# Patient Record
Sex: Female | Born: 1970 | Hispanic: Yes | Marital: Married | Smoking: Never smoker
Health system: Southern US, Community
[De-identification: ages and names within clinical notes are randomized; demographics above are authoritative.]

## PROBLEM LIST (undated history)

## (undated) DIAGNOSIS — K759 Inflammatory liver disease, unspecified: Secondary | ICD-10-CM

## (undated) DIAGNOSIS — N6323 Unspecified lump in the left breast, lower outer quadrant: Secondary | ICD-10-CM

## (undated) HISTORY — PX: CHOLECYSTECTOMY: SHX55

## (undated) HISTORY — DX: Unspecified lump in the left breast, lower outer quadrant: N63.23

---

## 2014-06-06 ENCOUNTER — Ambulatory Visit: Payer: Self-pay | Admitting: Family Medicine

## 2014-09-11 ENCOUNTER — Ambulatory Visit: Payer: Self-pay | Admitting: Surgery

## 2014-09-11 LAB — COMPREHENSIVE METABOLIC PANEL
ALBUMIN: 3.8 g/dL (ref 3.4–5.0)
Alkaline Phosphatase: 59 U/L
Anion Gap: 6 — ABNORMAL LOW (ref 7–16)
BUN: 12 mg/dL (ref 7–18)
Bilirubin,Total: 0.4 mg/dL (ref 0.2–1.0)
CO2: 28 mmol/L (ref 21–32)
CREATININE: 0.91 mg/dL (ref 0.60–1.30)
Calcium, Total: 8.6 mg/dL (ref 8.5–10.1)
Chloride: 104 mmol/L (ref 98–107)
EGFR (African American): >60
EGFR (Non-African Amer.): >60
GLUCOSE: 101 mg/dL — AB (ref 65–99)
Osmolality: 276 (ref 275–301)
Potassium: 3.8 mmol/L (ref 3.5–5.1)
SGOT(AST): 22 U/L (ref 15–37)
SGPT (ALT): 41 U/L
Sodium: 138 mmol/L (ref 136–145)
TOTAL PROTEIN: 9.1 g/dL — AB (ref 6.4–8.2)

## 2014-09-11 LAB — CBC WITH DIFFERENTIAL/PLATELET
Basophil #: 0 10*3/uL (ref 0.0–0.1)
Basophil %: 0.3 %
EOS ABS: 0.1 10*3/uL (ref 0.0–0.7)
Eosinophil %: 0.9 %
HCT: 41 % (ref 35.0–47.0)
HGB: 13.5 g/dL (ref 12.0–16.0)
Lymphocyte #: 1.8 10*3/uL (ref 1.0–3.6)
Lymphocyte %: 19 %
MCH: 29.9 pg (ref 26.0–34.0)
MCHC: 32.9 g/dL (ref 32.0–36.0)
MCV: 91 fL (ref 80–100)
MONO ABS: 0.9 x10 3/mm (ref 0.2–0.9)
Monocyte %: 9 %
NEUTROS ABS: 6.9 10*3/uL — AB (ref 1.4–6.5)
NEUTROS PCT: 70.8 %
PLATELETS: 319 10*3/uL (ref 150–440)
RBC: 4.51 10*6/uL (ref 3.80–5.20)
RDW: 12.9 % (ref 11.5–14.5)
WBC: 9.7 10*3/uL (ref 3.6–11.0)

## 2014-09-11 LAB — URINALYSIS, COMPLETE
BACTERIA: NONE SEEN
BILIRUBIN, UR: NEGATIVE
Blood: NEGATIVE
GLUCOSE, UR: NEGATIVE mg/dL (ref 0–75)
Ketone: NEGATIVE
Leukocyte Esterase: NEGATIVE
Nitrite: NEGATIVE
Ph: 8 (ref 4.5–8.0)
Protein: NEGATIVE
RBC,UR: NONE SEEN /HPF (ref 0–5)
SPECIFIC GRAVITY: 1.02 (ref 1.003–1.030)
Squamous Epithelial: NONE SEEN
WBC UR: NONE SEEN /HPF (ref 0–5)

## 2014-09-11 LAB — TROPONIN I: Troponin-I: <0.02 ng/mL

## 2014-09-11 LAB — LIPASE, BLOOD: Lipase: 356 U/L (ref 73–393)

## 2015-03-09 NOTE — H&P (Signed)
Subjective/Chief Complaint abdominal pain   History of Present Illness 44 y/o female with 2 days of severe epigastric and RUQ pain which radiates into her right back associated with nausea and emesis.  SHe has had several similar but less intense episodes over the last 6-12 months. no family hx of gallstones, no sick contacts. no fevers and no jaundice.  She also has complaints of chronic left shoulder pain.   Past History none   Code Status Full Code   Past Med/Surgical Hx:  Denies medical history:   Denies surgical history.:   ALLERGIES:  Seafood: Unknown  Peanuts: Unknown  Family and Social History:  Family History Non-Contributory   Social History negative tobacco, negative ETOH, negative Illicit drugs   Place of Living Home   Review of Systems:  Subjective/Chief Complaint see above   Physical Exam:  GEN no acute distress, obese   HEENT pale conjunctivae, PERRL   NECK supple   RESP normal resp effort  clear BS   CARD regular rate   ABD positive tenderness  no liver/spleen enlargement  soft   EXTR negative cyanosis/clubbing, negative edema   SKIN normal to palpation, No rashes   NEURO cranial nerves intact   PSYCH A+O to time, place, person, good insight   Lab Results: Hepatic:  27-Oct-15 05:13   Bilirubin, Total 0.4  Alkaline Phosphatase 59 (46-116 NOTE: New Reference Range 06/05/14)  SGPT (ALT) 41 (14-63 NOTE: New Reference Range 06/05/14)  SGOT (AST) 22  Total Protein, Serum  9.1  Albumin, Serum 3.8  Cardiology:  27-Oct-15 05:28   Ventricular Rate 70  Atrial Rate 70  P-R Interval 158  QRS Duration 94  QT 414  QTc 447  P Axis 41  R Axis 74  T Axis 62  ECG interpretation Normal sinus rhythm Normal ECG No previous ECGs available ----------unconfirmed---------- Confirmed by OVERREAD, NOT ([id number]), editor PEARSON, BARBARA ([id number]) on 09/11/2014 10:18:00 AM  Routine Chem:  27-Oct-15 05:13   Glucose, Serum  101  BUN 12  Creatinine (comp)  0.91  Sodium, Serum 138  Potassium, Serum 3.8  Chloride, Serum 104  CO2, Serum 28  Calcium (Total), Serum 8.6  Osmolality (calc) 276  eGFR (African American) >60  eGFR (Non-African American) >60 (eGFR values <75m/min/1.73 m2 may be an indication of chronic kidney disease (CKD). Calculated eGFR, using the MRDR Study equation, is useful in  patients with stable renal function. The eGFR calculation will not be reliable in acutely ill patients when serum creatinine is changing rapidly. It is not useful in patients on dialysis. The eGFR calculation may not be applicable to patients at the low and high extremes of body sizes, pregnant women, and vetetarians.)  Anion Gap  6  Lipase 356 (Result(s) reported on 11 Sep 2014 at 0[facility])  Cardiac:  27-Oct-15 05:13   Troponin I < 0.02 (0.00-0.05 0.05 ng/mL or less: NEGATIVE  Repeat testing in 3-6 hrs  if clinically indicated. >0.05 ng/mL: POTENTIAL  MYOCARDIAL INJURY. Repeat  testing in 3-6 hrs if  clinically indicated. NOTE: An increase or decrease  of 30% or more on serial  testing suggests a  clinically important change)  Routine UA:  27-Oct-15 05:13   Color (UA) Yellow  Clarity (UA) Turbid  Glucose (UA) Negative  Bilirubin (UA) Negative  Ketones (UA) Negative  Specific Gravity (UA) 1.020  Blood (UA) Negative  pH (UA) 8.0  Protein (UA) Negative  Nitrite (UA) Negative  Leukocyte Esterase (UA) Negative (Result(s) reported on 11 Sep 2014  at 06:57AM.)  RBC (UA) NONE SEEN  WBC (UA) NONE SEEN  Bacteria (UA) NONE SEEN  Epithelial Cells (UA) NONE SEEN  Mucous (UA) PRESENT  Amorphous Crystal (UA) PRESENT (Result(s) reported on 11 Sep 2014 at 06:57AM.)  Routine Hem:  27-Oct-15 05:13   WBC (CBC) 9.7  RBC (CBC) 4.51  Hemoglobin (CBC) 13.5  Hematocrit (CBC) 41.0  Platelet Count (CBC) 319  MCV 91  MCH 29.9  MCHC 32.9  RDW 12.9  Neutrophil % 70.8  Lymphocyte % 19.0  Monocyte % 9.0  Eosinophil % 0.9  Basophil % 0.3   Neutrophil #  6.9  Lymphocyte # 1.8  Monocyte # 0.9  Eosinophil # 0.1  Basophil # 0.0 (Result(s) reported on 11 Sep 2014 at 06:16AM.)   Radiology Results: [place]:    27-Oct-15 08:49, US Abdomen Limited Survey  US Abdomen Limited Survey  REASON FOR EXAM:    ruq pain  COMMENTS:   Body Site: Gallbladder, Liver, Common Bile Duct    PROCEDURE: [place]  - US ABDOMEN LIMITED SURVEY  - Sep 11 2014  8:49AM     CLINICAL DATA:  Right upper quadrant pain    EXAM:  US ABDOMEN LIMITED - RIGHT UPPER QUADRANT    COMPARISON:  None.    FINDINGS:  Gallbladder:  Multiple gallstones and sludge noted within gallbladder. Nonmobile  gallstone at gallbladder neck measures 1.3 x 1.2 cm. There is  thickening of gallbladder wall up to 4 mm. There is pericholecystic  fluid. The sonographic Percell Miller sign could not be assessed as the  patient is medicated. Findings are highly suspicious for acute  cholecystitis. Clinical correlation is necessary.    Common bile duct:    Diameter: 2.7 mm in diameter within normallimits.    Liver:    No focal hepatic mass. There is coarse increased echogenicity of the  liver highly suspicious for fatty infiltration.   IMPRESSION:  1. Multiple gallstones and sludge noted within gallbladder. There is  thickening of gallbladder wall and pericholecystic fluid. Nonmobile  gallstone in gallbladder neck region measures 1.3 cm. Findings are  highly suspicious for acute cholecystitis. Clinical correlation is  necessary.  2. Normal CBD.  3. Probable fatty infiltration of the liver.      Electronically Signed    By: Lahoma Crocker M.D.    On: 09/11/2014 08:54       Verified By: Ephraim Hamburger, M.D.,  LabUnknown:  PACS Image    Assessment/Admission Diagnosis 45 y/o female with acute calculus cholecystitis and likely hydrops.   Plan Admit, hydrate, laparoscopic cholecystectomy, discussed with a [facility] risks of surgery including bleeding, bile duct injury, need for open procedure.    Electronic Signatures: Sherri Rad (MD)  (Signed 27-Oct-15 11:27)  Authored: CHIEF COMPLAINT and HISTORY, PAST MEDICAL/SURGIAL HISTORY, ALLERGIES, FAMILY AND SOCIAL HISTORY, REVIEW OF SYSTEMS, PHYSICAL EXAM, LABS, Radiology, ASSESSMENT AND PLAN   Last Updated: 27-Oct-15 11:27 by Sherri Rad (MD)

## 2015-03-09 NOTE — Op Note (Signed)
PATIENT NAME:  Kristen Kim, Kristen Kim MR#:  [id number] DATE OF BIRTH:  [birth year]  DATE OF PROCEDURE:  09/11/2014  PREOPERATIVE DIAGNOSIS: Acute calculus cholecystitis.   POSTOPERATIVE DIAGNOSIS: Acute calculus cholecystitis.    PROCEDURE PERFORMED: Laparoscopic cholecystectomy.   SURGEON: Devine Klingel A. Egbert GaribaldiBird, MD FACS  ASSISTANT SURGEON: Scrub technologist x 2.   TYPE OF ANESTHESIA: General oral endotracheal.   FINDINGS: Impacted stone in gallbladder neck, acute cholecystitis and multiple stones.   SPECIMENS: Gallbladder with contents to pathology.   ESTIMATED BLOOD LOSS: 25 mL.   DRAINS: None.   COUNTS: LAP and needle count correct x 2.   DESCRIPTION OF PROCEDURE: With informed consent obtained from the patient with the use of a certified health interpreter for Spanish, she was brought to the Operating Room and positioned supine. General oral endotracheal anesthesia was induced. The patient's abdomen was widely prepped and draped with ChloraPrep solution and a timeout was observed.   A blunt 12 mm Hassan trocar was placed through an open technique through a transversely oriented skin incision with stay sutures being passed through the fascia. Pneumoperitoneum was established to a total of 15 mmHg. The patient was then positioned in reverse Trendelenburg and tilted right side up. A 5 mm Bladeless trocar was placed in the epigastric region following local anesthesia infiltration. Two 5-mm  assistant ports were then placed in the right subcostal margin.   The gallbladder was grasped along its fundus and elevated towards the right shoulder. Lateral traction was achieved on Hartmann pouch. Dissection of the hepatoduodenal ligament demonstrated a critical view of safety cholecystectomy, and this was achieved with hook electrocautery as well as blunt technique and no energy. Cystic artery was identified and doubly clipped on the portal side, singly clipped on the gallbladder side and divided. The  cystic duct was rather thickened. As such, the 5 mm trocar was exchanged for a 12 mm trocar in the epigastric region and the cystic duct was divided with a single fire of the GIA 45 stapler with blue load application. The gallbladder was then retrieved off the gallbladder fossa utilizing hook cautery apparatus, placed into an Endo Catch device and retrieved. During the retrievel process, inspection of the umbilical port site demonstrated no evidence of bowel injury or hernia.  With the  specimen being removed the pneumoperitoneum was then re-established. The right upper quadrant was irrigated with a total of 1 liter of normal saline and aspirated dry and point hemostasis was obtained in the gallbladder fossa. With hemostasis ensured on the operative field, ports were then removed under direct visualization.   The infraumbilical fascial defect was reapproximated with an additional figure-of-eight #0 Vicryl suture in vertical orientation. The existing stay sutures tied to each other. A total of 20 mL of 0.25% plain Marcaine was then infiltrated along all skin and fascial incisions prior to closure. Subcutaneous tissues were then inspected for hemostasis and point hemostasis was obtained with cautery. Skin edges were reapproximated utilizing a combination of simple and vertical mattress 4-0 nylon sutures. Sterile dressings were then applied, and the patient was then subsequently extubated and taken to the recovery room in stable and satisfactory condition by anesthesia services.      ____________________________ Redge GainerMark A. Egbert GaribaldiBird, MD mab:AT D: 09/11/2014 18:26:02 ET T: 09/12/2014 04:43:23 ET JOB#: [id number]  cc: Loraine LericheMark A. Egbert GaribaldiBird, MD, <Dictator> Raynald KempMARK A Naika Noto MD ELECTRONICALLY SIGNED 09/16/2014 17:27

## 2015-03-11 LAB — SURGICAL PATHOLOGY

## 2015-11-17 HISTORY — PX: BREAST BIOPSY: SHX20

## 2016-04-22 ENCOUNTER — Ambulatory Visit
Admission: RE | Admit: 2016-04-22 | Discharge: 2016-04-22 | Disposition: A | Discharge status: Home / Self Care | Payer: Self-pay | Source: Ambulatory Visit | Attending: Oncology | Admitting: Oncology

## 2016-04-22 ENCOUNTER — Other Ambulatory Visit: Payer: Self-pay | Admitting: *Deleted

## 2016-04-22 ENCOUNTER — Encounter: Payer: Self-pay | Admitting: *Deleted

## 2016-04-22 ENCOUNTER — Ambulatory Visit: Payer: Self-pay | Attending: Oncology | Admitting: *Deleted

## 2016-04-22 VITALS — BP 128/82 | HR 83 | Temp 97.2°F | Ht 67.72 in | Wt 183.8 lb

## 2016-04-22 DIAGNOSIS — N63 Unspecified lump in unspecified breast: Secondary | ICD-10-CM

## 2016-04-22 NOTE — Patient Instructions (Signed)
Gave patient hand-out, Women Staying Healthy, Active and Well from [facility], with education on breast health, pap smears, heart and colon health. 

## 2016-04-22 NOTE — Progress Notes (Signed)
Subjective:     Patient ID: Kristen Kim, female   DOB: February 07, 1971, 45 y.o.   MRN: [id number]  HPI   Review of Systems     Objective:   Physical Exam  Pulmonary/Chest: Right breast exhibits mass. Right breast exhibits no inverted nipple, no nipple discharge, no skin change and no tenderness. Left breast exhibits no inverted nipple, no nipple discharge, no skin change and no tenderness. Breasts are asymmetrical.    Patient states she had one episode of bilateral clear nipple discharge prior to her period 2 weeks ago - states she found wetness in her bra       Assessment:     45 year [place] referred from [facility] for a history of a left breast mass.  Patient states she was noted to have a left breast mass 3-4 years ago in [place].  Last mammo on 06/06/14 was a birads 3 with area of concern at 12:00 left breast, with no follow-up.  Lloyda, the interpreter present during the interview and exam.  On  Clinical breast exam I can palpate a discreet 1 cm mass at 7:00 right breast 3 cm from the areola.  Patient states this has been there.  There is very firm thickening in the upper outer quadrants, and an asymmetrical thickening at 12:00 left breast.  Taught self breast awareness and importance of follow up if any abnormal finding on mammogram.  She is agreeable.  Patient has been screened for eligibility.  She does not have any insurance, Medicare or Medicaid.  She also meets financial eligibility.  Hand-out given on the Affordable Care Act.  Plan:     Bilateral diagnostic mammogram with ultrasound ordered.  Will follow up per [facility] protocol.

## 2016-04-23 ENCOUNTER — Other Ambulatory Visit: Payer: Self-pay | Admitting: *Deleted

## 2016-04-23 DIAGNOSIS — N63 Unspecified lump in unspecified breast: Secondary | ICD-10-CM

## 2016-05-01 ENCOUNTER — Ambulatory Visit
Admission: RE | Admit: 2016-05-01 | Discharge: 2016-05-01 | Disposition: A | Discharge status: Home / Self Care | Payer: Self-pay | Source: Ambulatory Visit | Attending: Oncology | Admitting: Oncology

## 2016-05-01 DIAGNOSIS — N63 Unspecified lump in unspecified breast: Secondary | ICD-10-CM

## 2016-05-04 LAB — SURGICAL PATHOLOGY

## 2016-05-05 ENCOUNTER — Telehealth: Payer: Self-pay | Admitting: *Deleted

## 2016-05-05 NOTE — Telephone Encounter (Signed)
Tried to call patient via the language line interpreter [place] # [phone], but the number was not reachable.  Also tried the patient's spouse's number but no answer and no voicemail available.  Will try and call again tomorrow to give biopsy results.  If not available at that time will mail patient a letter.

## 2016-05-06 ENCOUNTER — Telehealth: Payer: Self-pay | Admitting: *Deleted

## 2016-05-06 NOTE — Telephone Encounter (Signed)
Tried to call patient again today with her biopsy results via the language line interpreter Jonna ClarkZohany [phone].  The patients home number and work number seems to be an invalid number.  Tried to call her husbands number, and no answer or voicemail.  Will send patient a letter with her results.

## 2016-05-11 ENCOUNTER — Encounter: Payer: Self-pay | Admitting: *Deleted

## 2016-05-11 NOTE — Progress Notes (Signed)
Letter translated and mailed with negative biopsy results and need to return in one year with annual screening.

## 2017-04-28 ENCOUNTER — Encounter: Payer: Self-pay | Admitting: *Deleted

## 2017-04-28 ENCOUNTER — Ambulatory Visit: Payer: Self-pay | Attending: Oncology

## 2017-04-28 VITALS — BP 100/64 | HR 72 | Temp 98.5°F | Ht 67.0 in | Wt 177.4 lb

## 2017-04-28 DIAGNOSIS — N63 Unspecified lump in unspecified breast: Secondary | ICD-10-CM

## 2017-04-28 NOTE — Progress Notes (Signed)
Subjective:     Patient ID: Alphonsa OverallMavi Garcia Castillo, female   DOB: Jun 17, 1971, 46 y.o.   MRN: [id number]  HPI   Review of Systems     Objective:   Physical Exam  Pulmonary/Chest:    irregular mass palpated 3 o'clock left breast       Assessment:     46 year old hispanic patient returns for [facility] screening.  [name] Afanador interpreted exam.  Patient had bilateral benign breast biopsies in 2017.  Patient screened, and meets [facility] eligibility.  Patient does not have insurance, Medicare or Medicaid.  Handout given on Affordable Care Act.Instructed patient on breast self-exam using teach back method  Palpated bilateral symmetrical thickenings upper outer breasts.  Palpated irregular shaped mass 3 o'clock left breast. Patient not using any birth control.  States menstrual cycles are regular, and normal.  Encouraged patient discuss birth control, and perimenopausal symptoms with physician, and may want to establish GYN care.    Plan:     Joellyn Quailshristy Burton scheduled patient for bilateral diagnostic mammogram, and ultrasound.

## 2017-05-04 ENCOUNTER — Other Ambulatory Visit: Payer: Self-pay

## 2017-05-11 ENCOUNTER — Ambulatory Visit
Admission: RE | Admit: 2017-05-11 | Discharge: 2017-05-11 | Disposition: A | Discharge status: Home / Self Care | Payer: Self-pay | Source: Ambulatory Visit | Attending: Oncology | Admitting: Oncology

## 2017-05-11 DIAGNOSIS — N63 Unspecified lump in unspecified breast: Secondary | ICD-10-CM

## 2017-05-12 NOTE — Progress Notes (Signed)
Letter mailed from [facility] to notify of normal mammogram results.  Patient to return in one year for annual screening. 

## 2017-12-20 ENCOUNTER — Emergency Department: Payer: Commercial Managed Care - PPO

## 2017-12-20 ENCOUNTER — Other Ambulatory Visit: Payer: Self-pay

## 2017-12-20 ENCOUNTER — Emergency Department
Admission: EM | Admit: 2017-12-20 | Discharge: 2017-12-20 | Disposition: A | Discharge status: Home / Self Care | Payer: Commercial Managed Care - PPO | Attending: Emergency Medicine | Admitting: Emergency Medicine

## 2017-12-20 ENCOUNTER — Encounter: Payer: Self-pay | Admitting: Emergency Medicine

## 2017-12-20 DIAGNOSIS — M545 Low back pain, unspecified: Secondary | ICD-10-CM

## 2017-12-20 DIAGNOSIS — R109 Unspecified abdominal pain: Secondary | ICD-10-CM | POA: Diagnosis present

## 2017-12-20 LAB — COMPREHENSIVE METABOLIC PANEL
ALK PHOS: 44 U/L (ref 38–126)
ALT: 26 U/L (ref 14–54)
ANION GAP: 10 (ref 5–15)
AST: 33 U/L (ref 15–41)
Albumin: 4 g/dL (ref 3.5–5.0)
BILIRUBIN TOTAL: 0.6 mg/dL (ref 0.3–1.2)
BUN: 13 mg/dL (ref 6–20)
CALCIUM: 9.2 mg/dL (ref 8.9–10.3)
CO2: 21 mmol/L — ABNORMAL LOW (ref 22–32)
Chloride: 109 mmol/L (ref 101–111)
Creatinine, Ser: 0.9 mg/dL (ref 0.44–1.00)
GFR calc non Af Amer: >60 mL/min (ref >60)
Glucose, Bld: 130 mg/dL — ABNORMAL HIGH (ref 65–99)
POTASSIUM: 3.6 mmol/L (ref 3.5–5.1)
Sodium: 140 mmol/L (ref 135–145)
TOTAL PROTEIN: 9 g/dL — AB (ref 6.5–8.1)

## 2017-12-20 LAB — URINALYSIS, COMPLETE (UACMP) WITH MICROSCOPIC
BILIRUBIN URINE: NEGATIVE
Bacteria, UA: NONE SEEN
GLUCOSE, UA: NEGATIVE mg/dL
KETONES UR: NEGATIVE mg/dL
LEUKOCYTES UA: NEGATIVE
NITRITE: NEGATIVE
PROTEIN: NEGATIVE mg/dL
Specific Gravity, Urine: 1.014 (ref 1.005–1.030)
pH: 5 (ref 5.0–8.0)

## 2017-12-20 LAB — CBC
HEMATOCRIT: 38.8 % (ref 35.0–47.0)
HEMOGLOBIN: 13.3 g/dL (ref 12.0–16.0)
MCH: 30.3 pg (ref 26.0–34.0)
MCHC: 34.2 g/dL (ref 32.0–36.0)
MCV: 88.6 fL (ref 80.0–100.0)
Platelets: 347 10*3/uL (ref 150–440)
RBC: 4.38 MIL/uL (ref 3.80–5.20)
RDW: 13 % (ref 11.5–14.5)
WBC: 4.6 10*3/uL (ref 3.6–11.0)

## 2017-12-20 LAB — HCG, QUANTITATIVE, PREGNANCY: hCG, Beta Chain, Quant, S: <1 m[IU]/mL (ref <5)

## 2017-12-20 MED ORDER — HYDROCODONE-ACETAMINOPHEN 5-325 MG PO TABS
1.0000 | ORAL_TABLET | Freq: Once | ORAL | Status: AC
  Administered 2017-12-20: 1 via ORAL
  Filled 2017-12-20: qty 1

## 2017-12-20 MED ORDER — IBUPROFEN 600 MG PO TABS: 600.0000 mg | ORAL_TABLET | Freq: Three times a day (TID) | ORAL | 0 refills | Status: DC | PRN

## 2017-12-20 MED ORDER — IBUPROFEN 400 MG PO TABS
600.0000 mg | ORAL_TABLET | Freq: Once | ORAL | Status: AC
  Administered 2017-12-20: 600 mg via ORAL
  Filled 2017-12-20: qty 2

## 2017-12-20 MED ORDER — LIDOCAINE 5 % EX PTCH: 1.0000 | MEDICATED_PATCH | Freq: Two times a day (BID) | CUTANEOUS | 0 refills | Status: DC

## 2017-12-20 NOTE — ED Notes (Signed)
Pt to CT scan.

## 2017-12-20 NOTE — ED Notes (Addendum)
MD at bedside. Pt reports severe lower bilat back pain that started about 2 weeks ago. Reports heavy lifting at work.

## 2017-12-20 NOTE — Discharge Instructions (Signed)
Please use your ibuprofen 3 times a day as needed for severe symptoms and establish care with primary care within the next week for reevaluation.  Return to the emergency department sooner for any concerns whatsoever.  It was a pleasure to take care of you today, and thank you for coming to our emergency department.  If you have any questions or concerns before leaving please ask the nurse to grab me and I'm more than happy to go through your aftercare instructions again.  If you were prescribed any opioid pain medication today such as Norco, Vicodin, Percocet, morphine, hydrocodone, or oxycodone please make sure you do not drive when you are taking this medication as it can alter your ability to drive safely.  If you have any concerns once you are home that you are not improving or are in fact getting worse before you can make it to your follow-up appointment, please do not hesitate to call 911 and come back for further evaluation.  Merrily BrittleNeil Annayah Worthley, MD  Results for orders placed or performed during the hospital encounter of 12/20/17  Comprehensive metabolic panel  Result Value Ref Range   Sodium 140 135 - 145 mmol/L   Potassium 3.6 3.5 - 5.1 mmol/L   Chloride 109 101 - 111 mmol/L   CO2 21 (L) 22 - 32 mmol/L   Glucose, Bld 130 (H) 65 - 99 mg/dL   BUN 13 6 - 20 mg/dL   Creatinine, Ser 6.210.90 0.44 - 1.00 mg/dL   Calcium 9.2 8.9 - 30.810.3 mg/dL   Total Protein 9.0 (H) 6.5 - 8.1 g/dL   Albumin 4.0 3.5 - 5.0 g/dL   AST 33 15 - 41 U/L   ALT 26 14 - 54 U/L   Alkaline Phosphatase 44 38 - 126 U/L   Total Bilirubin 0.6 0.3 - 1.2 mg/dL   GFR calc non Af Amer >60 >60 mL/min   GFR calc Af Amer >60 >60 mL/min   Anion gap 10 5 - 15  CBC  Result Value Ref Range   WBC 4.6 3.6 - 11.0 K/uL   RBC 4.38 3.80 - 5.20 MIL/uL   Hemoglobin 13.3 12.0 - 16.0 g/dL   HCT 65.738.8 84.635.0 - 96.247.0 %   MCV 88.6 80.0 - 100.0 fL   MCH 30.3 26.0 - 34.0 pg   MCHC 34.2 32.0 - 36.0 g/dL   RDW 95.213.0 84.111.5 - 32.414.5 %   Platelets 347  150 - 440 K/uL  Urinalysis, Complete w Microscopic  Result Value Ref Range   Color, Urine YELLOW (A) YELLOW   APPearance CLEAR (A) CLEAR   Specific Gravity, Urine 1.014 1.005 - 1.030   pH 5.0 5.0 - 8.0   Glucose, UA NEGATIVE NEGATIVE mg/dL   Hgb urine dipstick SMALL (A) NEGATIVE   Bilirubin Urine NEGATIVE NEGATIVE   Ketones, ur NEGATIVE NEGATIVE mg/dL   Protein, ur NEGATIVE NEGATIVE mg/dL   Nitrite NEGATIVE NEGATIVE   Leukocytes, UA NEGATIVE NEGATIVE   RBC / HPF 0-5 0 - 5 RBC/hpf   WBC, UA 0-5 0 - 5 WBC/hpf   Bacteria, UA NONE SEEN NONE SEEN   Squamous Epithelial / LPF 0-5 (A) NONE SEEN   Mucus PRESENT   hCG, quantitative, pregnancy  Result Value Ref Range   hCG, Beta Chain, Quant, S <1 <5 mIU/mL   Ct Renal Stone Study  Result Date: 12/20/2017 CLINICAL DATA:  Left flank pain for 1 week. Right flank pain which began 5 days ago. EXAM: CT ABDOMEN AND PELVIS  WITHOUT CONTRAST TECHNIQUE: Multidetector CT imaging of the abdomen and pelvis was performed following the standard protocol without IV contrast. COMPARISON:  None FINDINGS: Lower chest: There is an 8 mm sub solid nodule in the left lower lobe, image 7 of series 4. Hepatobiliary: No focal liver abnormalities identified. Previous cholecystectomy. No biliary dilatation. Pancreas: Unremarkable. No pancreatic ductal dilatation or surrounding inflammatory changes. Spleen: Normal in size without focal abnormality. Adrenals/Urinary Tract: The adrenal glands appear within normal limits. No kidney stones or hydronephrosis identified. The urinary bladder appears normal. Stomach/Bowel: Stomach is within normal limits. Appendix appears normal. No evidence of bowel wall thickening, distention, or inflammatory changes. Vascular/Lymphatic: Normal appearance of the abdominal aorta. No enlarged retroperitoneal or mesenteric adenopathy. No enlarged pelvic or inguinal lymph nodes. Reproductive: Uterus and bilateral adnexa are unremarkable. Other: There is a  periumbilical hernia which contains fat only Musculoskeletal: No suspicious osseous lesions. IMPRESSION: 1. No acute findings within the abdomen or pelvis. 2. 8 mm sub solid nodule in the left lower lobe is identified. Initial follow-up with CT at 6-12 months is recommended to confirm persistence. If persistent, repeat CT is recommended every 2 years until 5 years of stability has been established. This recommendation follows the consensus statement: Guidelines for Management of Incidental Pulmonary Nodules Detected on CT Images: From the Fleischner Society 2017; Radiology 2017; 284:228-243. Electronically Signed   By: Signa Kell M.D.   On: 12/20/2017 10:58

## 2017-12-20 NOTE — ED Triage Notes (Signed)
L flank pain x 1 week, R flank pain began 5 days ago. States has had fevers.

## 2017-12-20 NOTE — ED Provider Notes (Signed)
[facility] Emergency Department Provider Note  ____________________________________________   First MD Initiated Contact with Patient 12/20/17 1028     (approximate)  I have reviewed the triage vital signs and the nursing notes.   HISTORY  Chief Complaint Flank Pain   HPI Trinaty Etheleen Sia is a 47 y.o. female self presents to the emergency department with 2 weeks of left greater than right low back pain.  She thinks that the pain is "my kidneys".  The pain is moderate severity throbbing aching.  It seems to radiate from her left flank towards her left anterior leg.  The pain is worse in the afternoon and improved in the fall.  She denies numbness or weakness.  She denies dysuria frequency or hesitancy.  She has taken 500 mg of acetaminophen several times a day with minimal relief.  She denies history of abdominal surgeries.  History reviewed. No pertinent past medical history.  There are no active problems to display for this patient.   Past Surgical History:  Procedure Laterality Date  . BREAST BIOPSY Bilateral 2017   PASH    Prior to Admission medications   Medication Sig Start Date End Date Taking? Authorizing Provider  ibuprofen (ADVIL,MOTRIN) 600 MG tablet Take 1 tablet (600 mg total) by mouth every 8 (eight) hours as needed. 12/20/17   Merrily Brittle, MD  lidocaine (LIDODERM) 5 % Place 1 patch onto the skin every 12 (twelve) hours. Remove & Discard patch within 12 hours or as directed by MD 12/20/17 12/20/18  Merrily Brittle, MD    Allergies Pineapple and Shellfish allergy  Family History  Problem Relation Age of Onset  . Breast cancer Neg Hx     Social History Social History   Tobacco Use  . Smoking status: Never Smoker  . Smokeless tobacco: Never Used  Substance Use Topics  . Alcohol use: Not on file  . Drug use: Not on file    Review of Systems Constitutional: No fever/chills Eyes: No visual changes. ENT: No sore  throat. Cardiovascular: Denies chest pain. Respiratory: Denies shortness of breath. Gastrointestinal: No abdominal pain.  No nausea, no vomiting.  No diarrhea.  No constipation. Genitourinary: Negative for dysuria. Musculoskeletal: Positive for back pain. Skin: Negative for rash. Neurological: Negative for headaches, focal weakness or numbness.   ____________________________________________   PHYSICAL EXAM:  VITAL SIGNS: ED Triage Vitals  Enc Vitals Group     BP 12/20/17 0900 (!) 142/73     Pulse Rate 12/20/17 0900 73     Resp 12/20/17 0900 18     Temp 12/20/17 0900 98.5 F (36.9 C)     Temp Source 12/20/17 0900 Oral     SpO2 12/20/17 0900 96 %     Weight 12/20/17 0900 173 lb (78.5 kg)     Height 12/20/17 0900 5\' 8"  (1.727 m)     Head Circumference --      Peak Flow --      Pain Score 12/20/17 0903 5     Pain Loc --      Pain Edu? --      Excl. in GC? --     Constitutional: Alert and oriented x4 well-appearing nontoxic no diaphoresis speaks in full clear sentences Eyes: PERRL EOMI. Head: Atraumatic. Nose: No congestion/rhinnorhea. Mouth/Throat: No trismus Neck: No stridor.   Cardiovascular: Normal rate, regular rhythm. Grossly normal heart sounds.  Good peripheral circulation. Respiratory: Normal respiratory effort.  No retractions. Lungs CTAB and moving good air Gastrointestinal: Soft  nondistended nontender no rebound or guarding no peritonitis no costovertebral tenderness Musculoskeletal: No lower extremity edema   No midline back tenderness neurovascularly intact Neurologic:  Normal speech and language. No gross focal neurologic deficits are appreciated. Skin:  Skin is warm, dry and intact. No rash noted. Psychiatric: Mood and affect are normal. Speech and behavior are normal.    ____________________________________________   DIFFERENTIAL includes but not limited to  Pyelonephritis, nephrolithiasis, muscular skeletal pain, cauda equina, radicular  pain ____________________________________________   LABS (all labs ordered are listed, but only abnormal results are displayed)  Labs Reviewed  COMPREHENSIVE METABOLIC PANEL - Abnormal; Notable for the following components:      Result Value   CO2 21 (*)    Glucose, Bld 130 (*)    Total Protein 9.0 (*)    All other components within normal limits  URINALYSIS, COMPLETE (UACMP) WITH MICROSCOPIC - Abnormal; Notable for the following components:   Color, Urine YELLOW (*)    APPearance CLEAR (*)    Hgb urine dipstick SMALL (*)    Squamous Epithelial / LPF 0-5 (*)    All other components within normal limits  CBC  HCG, QUANTITATIVE, PREGNANCY    Lab work reviewed by me with no acute disease __________________________________________  EKG   ____________________________________________  RADIOLOGY  CT stone reviewed by me with no acute disease ____________________________________________   PROCEDURES  Procedure(s) performed: no  Procedures  Critical Care performed: no  Observation: no ____________________________________________   INITIAL IMPRESSION / ASSESSMENT AND PLAN / ED COURSE  Pertinent labs & imaging results that were available during my care of the patient were reviewed by me and considered in my medical decision making (see chart for details).  The patient has back pain on the left radiating towards her left leg.  Lab work and CT scan are reassuring with no acute intra-abdominal pathology.  Her symptoms are worse with movement and lifting and are most consistent with musculoskeletal strain.  She feels improved after ibuprofen and hydrocodone.  I will help her establish care with primary care physician and discharge her home with a short course of nonsteroidals.  Strict return precautions have been given and the patient verbalizes understanding and agreement with the plan.      ____________________________________________   FINAL CLINICAL IMPRESSION(S) /  ED DIAGNOSES  Final diagnoses:  Acute bilateral low back pain without sciatica      NEW MEDICATIONS STARTED DURING THIS VISIT:  New Prescriptions   IBUPROFEN (ADVIL,MOTRIN) 600 MG TABLET    Take 1 tablet (600 mg total) by mouth every 8 (eight) hours as needed.   LIDOCAINE (LIDODERM) 5 %    Place 1 patch onto the skin every 12 (twelve) hours. Remove & Discard patch within 12 hours or as directed by MD     Note:  This document was prepared using Dragon voice recognition software and may include unintentional dictation errors.     Merrily Brittleifenbark, Jenesis Suchy, MD 12/20/17 1122

## 2017-12-20 NOTE — ED Notes (Signed)
Pt was unable to urinate at this time.

## 2017-12-20 NOTE — ED Notes (Signed)
FIRST NURSE NOTE:  Pt is non-english speaking,  Here with visitor that speaks english, visitor states the pt is complaining of kidney pain.  Interpreter requested on arrival. Pt in NAD at registration desk. Ambulatory in lobby without difficulty.

## 2018-07-06 ENCOUNTER — Ambulatory Visit: Payer: Self-pay

## 2018-08-31 ENCOUNTER — Other Ambulatory Visit: Payer: Self-pay

## 2018-08-31 ENCOUNTER — Ambulatory Visit: Payer: Self-pay | Attending: Oncology | Admitting: *Deleted

## 2018-08-31 ENCOUNTER — Encounter: Payer: Self-pay | Admitting: *Deleted

## 2018-08-31 ENCOUNTER — Encounter (INDEPENDENT_AMBULATORY_CARE_PROVIDER_SITE_OTHER): Payer: Self-pay

## 2018-08-31 VITALS — BP 117/79 | HR 73 | Temp 98.4°F | Ht 68.0 in | Wt 180.0 lb

## 2018-08-31 DIAGNOSIS — N6452 Nipple discharge: Secondary | ICD-10-CM

## 2018-08-31 NOTE — Progress Notes (Signed)
  Subjective:     Patient ID: Kristen Kim, female   DOB: November 08, 1971, 47 y.o.   MRN: [id number]  HPI   Review of Systems     Objective:   Physical Exam  Pulmonary/Chest: Right breast exhibits nipple discharge. Right breast exhibits no inverted nipple, no mass, no skin change and no tenderness. Left breast exhibits mass. Left breast exhibits no inverted nipple, no nipple discharge, no skin change and no tenderness.  Patient states she has had 4 episodes of "watery" spontaneous right nipple discharge over the last 2 years. - No discharge on exam today         Assessment:     47 year old Hispanic female returns to [facility] for annual screening.  [name], the interpreter present during the interview and exam.  Somewhat difficult clinical breast exam due to dense breast tissue and nodularity.  On clinical breast exam there is bilateral thickening in the upper outer quadrants, there is an approximate 2 cm nodule at 3:00 left breast where noted cysts are present on last mammogram, and new asymmetrical thickening is noted at 6:00 left breast along the inframammary ridge.  The patient complains of "watery" spontaneous right nipple discharge.  States she has had 4 episodes over the last 2 years where she has felt wet, and noticed the discharge.  Taught self breast awareness.  Last pap on 02/26/16 was negative / negative.  Next pap due in 2022.  Patient has been screened for eligibility.  She does not have any insurance, Medicare or Medicaid.  She also meets financial eligibility.  Hand-out given on the Affordable Care Act.    Plan:     Will get bilateral diagnostic mammogram and ultrasound.  Will follow-up per [facility] protocol.

## 2018-08-31 NOTE — Patient Instructions (Signed)
Gave patient hand-out, Women Staying Healthy, Active and Well from [facility], with education on breast health, pap smears, heart and colon health. 

## 2018-09-06 ENCOUNTER — Ambulatory Visit
Admission: RE | Admit: 2018-09-06 | Discharge: 2018-09-06 | Disposition: A | Discharge status: Home / Self Care | Payer: Self-pay | Source: Ambulatory Visit | Attending: Oncology | Admitting: Oncology

## 2018-09-06 ENCOUNTER — Other Ambulatory Visit: Payer: Self-pay | Admitting: *Deleted

## 2018-09-06 DIAGNOSIS — N6452 Nipple discharge: Secondary | ICD-10-CM

## 2018-09-06 DIAGNOSIS — N63 Unspecified lump in unspecified breast: Secondary | ICD-10-CM

## 2018-09-08 ENCOUNTER — Encounter: Payer: Self-pay | Admitting: *Deleted

## 2018-09-08 NOTE — Progress Notes (Signed)
Called [facility] to schedule patient a breast MRI as recommended by the radiologist.  [facility] is to call the patient to schedule her appointment.  Notified them to bill as [employer].  Informed they will need an interpreter.

## 2018-09-22 ENCOUNTER — Encounter: Payer: Self-pay | Admitting: *Deleted

## 2018-09-22 NOTE — Progress Notes (Signed)
Patient has not been scheduled for her breast MRI by [facility].  Tried to call patient today via Kandis Cocking, the interpreter.  No answer and no voicemail available.  I have mailed the patient a letter with an appointment for her breast MRI on 1126, 19 @ 7:45 at the [facility].

## 2018-10-11 ENCOUNTER — Ambulatory Visit
Admission: RE | Admit: 2018-10-11 | Discharge: 2018-10-11 | Disposition: A | Discharge status: Home / Self Care | Payer: Self-pay | Source: Ambulatory Visit | Attending: Oncology | Admitting: Oncology

## 2018-10-11 DIAGNOSIS — N63 Unspecified lump in unspecified breast: Secondary | ICD-10-CM

## 2018-10-11 MED ORDER — GADOBUTROL 1 MMOL/ML IV SOLN
8.0000 mL | Freq: Once | INTRAVENOUS | Status: AC | PRN
  Administered 2018-10-11: 8 mL via INTRAVENOUS

## 2018-10-12 ENCOUNTER — Other Ambulatory Visit: Payer: Self-pay | Admitting: Oncology

## 2018-10-12 DIAGNOSIS — N63 Unspecified lump in unspecified breast: Secondary | ICD-10-CM

## 2018-10-20 ENCOUNTER — Ambulatory Visit
Admission: RE | Admit: 2018-10-20 | Discharge: 2018-10-20 | Disposition: A | Discharge status: Home / Self Care | Payer: Self-pay | Source: Ambulatory Visit | Attending: Oncology | Admitting: Oncology

## 2018-10-20 DIAGNOSIS — N63 Unspecified lump in unspecified breast: Secondary | ICD-10-CM

## 2018-10-21 LAB — SURGICAL PATHOLOGY

## 2018-10-25 ENCOUNTER — Encounter: Payer: Self-pay | Admitting: *Deleted

## 2018-10-25 NOTE — Progress Notes (Signed)
Patient called wanting her results of her breast biopsy.  Heron, the interpreter on the phone for translation.  Explained to the patient that I had just gotten off the phone with Aaron MoseJoann Yokely from [facility] to confirm recommendations from her biopsy.  Reviewed benign findings, but need for surgical consult for possible excision of the fibroadenoma like lesion.  She also needs surgical consult for the right nipple discharge.  Patient is scheduled to see Dr. Earlene Plateravis on Thursday, 10/27/18 @ 3:15.  Reviewed that [facility] will pay for the consult, but if surgery is recommended she may have to complete a patient financial assistance application.

## 2018-10-26 ENCOUNTER — Ambulatory Visit: Payer: Self-pay

## 2018-10-27 ENCOUNTER — Encounter: Payer: Self-pay | Admitting: Surgery

## 2018-10-27 ENCOUNTER — Ambulatory Visit (INDEPENDENT_AMBULATORY_CARE_PROVIDER_SITE_OTHER): Payer: Self-pay | Admitting: Surgery

## 2018-10-27 ENCOUNTER — Other Ambulatory Visit: Payer: Self-pay

## 2018-10-27 VITALS — BP 121/83 | HR 72 | Temp 97.9°F | Ht 68.0 in | Wt 178.0 lb

## 2018-10-27 DIAGNOSIS — N6452 Nipple discharge: Secondary | ICD-10-CM

## 2018-10-27 DIAGNOSIS — N6323 Unspecified lump in the left breast, lower outer quadrant: Secondary | ICD-10-CM

## 2018-10-27 DIAGNOSIS — N6320 Unspecified lump in the left breast, unspecified quadrant: Secondary | ICD-10-CM

## 2018-10-27 HISTORY — DX: Unspecified lump in the left breast, lower outer quadrant: N63.23

## 2018-10-27 NOTE — H&P (View-Only) (Signed)
Surgical Clinic History and Physical  Referring provider:  No referring provider defined for this encounter.  HISTORY OF PRESENT ILLNESS (HPI):  47 y.o. female presents for evaluation of her Left breast mass and bilateral nipple discharge. Patient reports she noted her Left lower breast mass ~2 years ago, for which she last year underwent core needle biopsy, since which it has grown significantly in size and again recently underwent second core needle biopsy. She denies any pain associated with the mass. She also reports a >3 year history of intermittent Right > Left breast thin clear slightly milky nipple discharge that occurs cyclically during her menstrual periods, at which time she says her breasts also swell and may be somewhat sore as well. She denies ever having noted bloody, green, dark/brown, or purulent drainage and says the small amount of drainage she's noted on her bra/tissue/pad has never bothered her. She has one child, who she reports having breast fed and no first degree relatives with a history of breast cancer. None of her prior biopsies have revealed malignant or premalignant lesions, and she denies any recent fever/chills, unintentional weight loss, N/V, CP, or SOB.  All of the above history was obtained with the assistance of a certified medical Spanish-English translator.  PAST MEDICAL HISTORY (PMH):  No past medical history on file.   PAST SURGICAL HISTORY (PSH):  Past Surgical History:  Procedure Laterality Date  . 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 Authorizing Provider  ibuprofen (ADVIL,MOTRIN) 600 MG tablet Take 1 tablet (600 mg total) by mouth every 8 (eight) hours as needed. 12/20/17  Yes Rifenbark, Neil, MD  lidocaine (LIDODERM) 5 % Place 1 patch onto the skin every 12 (twelve) hours. Remove & Discard patch within 12 hours or as directed by MD 12/20/17 12/20/18 Yes  Rifenbark, Neil, MD    ALLERGIES:  Allergies  Allergen Reactions  . Pineapple   . Shellfish Allergy     SOCIAL HISTORY:  Social History   Socioeconomic History  . Marital status: Married    Spouse name: Not on file  . Number of children: Not on file  . Years of education: Not on file  . Highest education level: Not on file  Occupational History  . Not on file  Social Needs  . Financial resource strain: Not on file  . Food insecurity:    Worry: Not on file    Inability: Not on file  . Transportation needs:    Medical: Not on file    Non-medical: Not on file  Tobacco Use  . Smoking status: Never Smoker  . Smokeless tobacco: Never Used  Substance and Sexual Activity  . Alcohol use: Never    Frequency: Never  . Drug use: Never  . Sexual activity: Yes  Lifestyle  . Physical activity:    Days per week: Not on file    Minutes per session: Not on file  . Stress: Not on file  Relationships  . Social connections:    Talks on phone: Not on file    Gets together: Not on file    Attends religious service: Not on file    Active member of club or organization: Not on file    Attends meetings of clubs or organizations: Not on file    Relationship status: Not on file  . Intimate partner violence:    Fear of current or ex partner: Not   on file    Emotionally abused: Not on file    Physically abused: Not on file    Forced sexual activity: Not on file  Other Topics Concern  . Not on file  Social History Narrative  . Not on file    The patient currently resides (home / rehab facility / nursing home): Home The patient normally is (ambulatory / bedbound): Ambulatory  FAMILY HISTORY:  Family History  Problem Relation Age of Onset  . Colon cancer Mother 76  . Breast cancer Neg Hx     Otherwise negative/non-contributory.  REVIEW OF SYSTEMS:  Constitutional: denies any other weight loss, fever, chills, or sweats  Eyes: denies any other vision changes, history of eye injury   ENT: denies sore throat, hearing problems  Respiratory: denies shortness of breath, wheezing  Cardiovascular: denies chest pain, palpitations  Breasts: mass(es), nipple drainage, and pain as per HPI Gastrointestinal: denies abdominal pain, N/V, or diarrhea Musculoskeletal: denies any other joint pains or cramps  Skin: Denies any other rashes or skin discolorations Neurological: denies any other headache, dizziness, weakness  Psychiatric: Denies any other depression, anxiety   All other review of systems were otherwise negative   VITAL SIGNS:  BP 121/83   Pulse 72   Temp 97.9 F (36.6 C)   Ht 5' 8" (1.727 m)   Wt 178 lb (80.7 kg)   LMP 10/20/2018   SpO2 96%   BMI 27.06 kg/m   PHYSICAL EXAM:  Constitutional:  -- Normal body habitus  -- Awake, alert, and oriented x3  Eyes:  -- Pupils equally round and reactive to light  -- No scleral icterus  Ear, nose, throat:  -- No jugular venous distension -- No nasal drainage, bleeding Pulmonary:  -- No crackles  -- Equal breath sounds bilaterally -- Breathing non-labored at rest Cardiovascular:  -- S1, S2 present  -- No pericardial rubs  Breasts: -- Unable to appreciate any nipple discharge bilaterally, no nipple inversion or skin retraction -- Easily palpable non-tender to palpation 3.5 cm (wide) x 2 cm (tall) Left inferior breast mass at ~5 - 6 o'clock position -- No breast tenderness to palpation, no Right breast mass(es), and no axillary lymphadenopathy appreciated Gastrointestinal:  -- Abdomen soft, nontender, non-distended, no guarding/rebound  -- No abdominal masses appreciated, pulsatile or otherwise  Musculoskeletal and Integumentary:  -- Wounds or skin discoloration: None appreciated -- Extremities: B/L UE and LE FROM, hands and feet warm, no edema  Neurologic:  -- Motor function: Intact and symmetric -- Sensation: Intact and symmetric  Labs:  CBC Latest Ref Rng & Units 12/20/2017 09/11/2014  WBC 3.6 - 11.0 K/uL  4.6 9.7  Hemoglobin 12.0 - 16.0 g/dL 13.3 13.5  Hematocrit 35.0 - 47.0 % 38.8 41.0  Platelets 150 - 440 K/uL 347 319   CMP Latest Ref Rng & Units 12/20/2017 09/11/2014  Glucose 65 - 99 mg/dL 130(H) 101(H)  BUN 6 - 20 mg/dL 13 12  Creatinine 0.44 - 1.00 mg/dL 0.90 0.91  Sodium 135 - 145 mmol/L 140 138  Potassium 3.5 - 5.1 mmol/L 3.6 3.8  Chloride 101 - 111 mmol/L 109 104  CO2 22 - 32 mmol/L 21(L) 28  Calcium 8.9 - 10.3 mg/dL 9.2 8.6  Total Protein 6.5 - 8.1 g/dL 9.0(H) 9.1(H)  Total Bilirubin 0.3 - 1.2 mg/dL 0.6 0.4  Alkaline Phos 38 - 126 U/L 44 59  AST 15 - 41 U/L 33 22  ALT 14 - 54 U/L 26 41   Imaging studies:    Bilateral Diagnostic Mammogram and Targeted Ultrasound (09/06/2018) 1. Indeterminate hypoechoic mass within the LEFT breast at the 5 o'clock axis, 4 cm from the nipple, measuring 2.6 cm, corresponding to the palpable area of concern. This may represent a fibroadenoma or phyllodes. Recommend ultrasound-guided biopsy to ensure benignity. 2. No evidence of malignancy within the RIGHT breast. 3. Patient describes current RIGHT-sided clear nipple discharge, and previous LEFT-sided nipple discharge. This is somewhat suspicious and, per protocol, breast MRI is recommended to exclude associated malignancy.  Bilateral Breast MRI (10/11/2018) 1. Oval circumscribed heterogeneously enhancing mass within the lower outer quadrant of the LEFT breast, at middle depth, measuring 2.4 cm, compatible with fibroadenoma or phyllodes and corresponding  to the indeterminate mass described on recent mammogram and  ultrasound report of 09/06/2018. Ultrasound-guided biopsy is  recommended to ensure benignity. 2. No MRI evidence of malignancy within the RIGHT breast. Numerous benign cysts within the RIGHT breast, presumed fibrocystic change. 3. Causes of unilateral nipple discharge include: Hormonal changes, fibrocystic changes, benign papilloma, abscess/mastitis, birth control pills,  endocrine disorders, injury/trauma to breast, duct ectasia, medications, prolactinoma, and breast cancer. As is evident from this list, nipple discharge often stems from a benign condition, however, breast cancer is a possibility when unilateral spontaneous persistent single duct discharge (especially bloody or clear discharge) is present.  Ultrasound-guided Left Breast Biopsy (10/20/2018) Ultrasound guided biopsy of a left breast mass at 5:00.  No apparent complications.  Assessment/Plan:  47 y.o. female with enlarging Left inferior breast mass and chronic cyclical intermittent bilateral clear non-bloody nipple discharge.   - imaging and pathology results discussed with patient   - with assistance of certified medical Spanish-English medical translator, all risks, benefits, and alternatives to excisional biopsy of enlarging Left inferior breast mass and Right vs B/L central duct excision for definitive diagnosis and definitive therapy (nipple discharge) were discussed with the patient, all of her questions were answered to her expressed satisfaction, patient expresses she wishes to proceed with excision of enlarging Left inferior breast mass only at this time, and informed consent was obtained accordingly.  - will plan for excisional biopsy of palpable enlarging Left inferior breast mass first week in January per patient request due to upcoming work schedule  - anticipate return to clinic 2 weeks following above elective procedure  - instructed to call if any questions or concerns  All of the above recommendations were discussed with the patient, and all of patient's questions were answered to her expressed satisfaction.  Thank you for the opportunity to participate in this patient's care.  -- Jason E. Davis, MD, RPVI [place]: [facility] General Surgery - Partnering for exceptional care. Office: [phone] 

## 2018-10-27 NOTE — Patient Instructions (Addendum)
We have spoken today about removing a lump in your breast. This will be scheduled with Dr Earlene Plater at [facility] on 11/18/18. You will pre admit at the hospital and we will call with that date and time.    You will most likely be able to leave the hospital several hours after your surgery. Rarely, a patient needs to stay over night but this is a possibility.  Plan to tenatively be off work for 1-2 weeks following the surgery and may return with approximately 4 more weeks of a lifting restriction, no greater than 15 lbs.     Mastectoma Lumpectomy Georgina Peer, a veces llamada mastectoma parcial, es [place] para extirpar un tumor canceroso o bulto de la mama (el tumor). Es una forma de ciruga para "conservar la mama" o "preservar la mama". Esto significa que se extirpa el tejido canceroso, pero la mama permanece intacta. Durante una [place], se extirpa la porcin de la mama que contiene el tumor. Tambin puede extirparse el tejido normal que rodea el bulto para asegurarse de que todo el tumor ha sido extirpado. Los ganglios linfticos que estn en la axila tambin pueden extirparse y analizarse para ver si el cncer se ha diseminado. Informe al mdico acerca de lo siguiente:  Cualquier alergia que tenga.  Todos los [employer], incluidos vitaminas, hierbas, gotas oftlmicas, cremas y [address] de [address].  Cualquier problema previo que usted o los miembros de su familia hayan tenido con anestsicos.  Enfermedades de la sangre que tenga.  Cirugas previas.  Cualquier enfermedad que tenga.  Si est embarazada o podra estarlo. Cules son los riesgos? En general, se trata de un procedimiento seguro. Sin embargo, pueden ocurrir complicaciones, por ejemplo:  Hemorragia.  Una infeccin.  Dolor, hinchazn, debilidad o entumecimiento en el brazo del lado de la [place].  Hinchazn temporaria.  Cambio en la forma de la mama, especialmente si se extirpa una porcin  grande.  Tejido cicatricial que se forma en el lugar de la ciruga y que es duro al tacto.  Qu ocurre antes del procedimiento? Mantenerse hidratado Siga las indicaciones del mdico acerca de la hidratacin, las cuales pueden incluir lo siguiente:  Hasta 2horas antes del procedimiento, puede beber lquidos transparentes, como agua, jugos frutales transparentes, caf negro y t solo.  Restricciones en las comidas y [address]  Siga las indicaciones del mdico respecto de las comidas y bebidas, las cuales pueden incluir lo siguiente: ? Ocho horas antes del procedimiento, deje de ingerir comidas o alimentos pesados, por ejemplo, carne, alimentos fritos o alimentos grasos. ? Seis horas antes del procedimiento, deje de ingerir comidas o alimentos livianos, como tostadas o cereales. ? Seis horas antes del procedimiento, deje de beber [place] o bebidas que [employer]. ? Dos horas antes del procedimiento, deje de beber lquidos transparentes. Medicamentos  Consulte al mdico si debe hacer o no lo siguiente: ? Cambiar o suspender los medicamentos que toma habitualmente. Esto es muy importante si toma medicamentos para la diabetes o anticoagulantes. ? Tomar medicamentos como aspirina e ibuprofeno. Estos medicamentos pueden tener un efecto anticoagulante en la [place]. No tome estos medicamentos antes del procedimiento si el mdico le indica que no lo haga.  Pueden darle antibiticos para ayudar a prevenir las infecciones. Instrucciones generales  Pregntele al mdico cmo se marcar o se Audiological scientist de la ciruga.  Podrn examinarla para detectar lquido extra alrededor de los ganglios linfticos (linfoedema).  Haga planes para que una persona lo lleve a su casa  desde el hospital o la clnica.  El da de la [place], Oregonel mdico usar una mamografa o una ecografa para Careers adviserubicar y Interior and spatial designermarcar el tumor en la mama. Estas marcas mostrarn dnde deber Intelhacerse la incisin. Qu ocurre durante el  procedimiento?  Para reducir el riesgo de infecciones: ? El equipo mdico se lavar o se desinfectar las manos. ? Le lavarn la piel con jabn.  Se le colocar un tubo (catter) intravenoso en una de las venas.  Le administrarn uno o ms de los siguientes medicamentos: ? Un medicamento para ayudarlo a relajarse (sedante). ? Un medicamento para adormecer la zona (anestesia local). ? Un medicamento que lo har dormir (anestesia general).  El mdico usar un bistur elctrico que emplea calor para minimizar el sangrado Medical laboratory scientific officer(bistur electrocauterizador). Se realizar una incisin curva que sigue la forma normal de la [place]. Este tipo de incisin permitir que las cicatrices sean mnimas y que la cicatrizacin sea mejor.  El tumor se extirpar con parte del tejido circundante. Luego se enviar a un laboratorio para su anlisis. El mdico podra extirpar los ganglios linfticos en este momento, si fuera necesario.  Pueden colocarle un drenaje en la zona de la mama o axila para recolectar el lquido que se acumule despus de la [place]. El drenaje se conectar con una pera de goma fuera del cuerpo para extraer el lquido.  La incisin se cerrar con puntos (suturas).  Pueden colocarle una venda (vendaje) sobre Immunologistel lugar de la incisin. Este procedimiento puede variar segn el mdico y el hospital. Ladell HeadsQu sucede despus del procedimiento?  Le controlarn la presin arterial, la frecuencia cardaca, la frecuencia respiratoria y Air cabin crewel nivel de oxgeno en la sangre hasta que haya desaparecido el efecto de los medicamentos administrados.  Le administrarn medicamentos para Chief Technology Officerel dolor.  Pueden colocarle un drenaje en la mama durante 2a 3das para evitar la acumulacin de sangre (hematoma) en la mama. Le darn instrucciones para el cuidado del drenaje antes de regresar a su casa.  Le aplicarn un vendaje compresivo durante 1o 2das para evitar el sangrado o la hinchazn. El vendaje compresivo tendr la  apariencia de una pieza delgada de tela o venda elstica. Consulte a su mdico cmo debe cuidar el vendaje en su casa.  Es posible que le coloquen una manga ajustada para usar en el brazo del lado de la [place]. Use esta manga como se lo haya indicado el mdico.  No conduzca durante 24horas si le administraron un sedante. Esta informacin no tiene Theme park managercomo fin reemplazar el consejo del mdico. Asegrese de hacerle al mdico cualquier pregunta que tenga. Document Released: 07/05/2013 Document Revised: 02/09/2017 Document Reviewed: 05/05/2016 Elsevier Interactive Patient Education  [employer]2018 Elsevier Inc.

## 2018-10-27 NOTE — Progress Notes (Signed)
Surgical Clinic History and Physical  Referring provider:  No referring provider defined for this encounter.  HISTORY OF PRESENT ILLNESS (HPI):  47 y.o. female presents for evaluation of her Left breast mass and bilateral nipple discharge. Patient reports she noted her Left lower breast mass ~2 years ago, for which she last year underwent core needle biopsy, since which it has grown significantly in size and again recently underwent second core needle biopsy. She denies any pain associated with the mass. She also reports a >3 year history of intermittent Right > Left breast thin clear slightly milky nipple discharge that occurs cyclically during her menstrual periods, at which time she says her breasts also swell and may be somewhat sore as well. She denies ever having noted bloody, green, dark/brown, or purulent drainage and says the small amount of drainage she's noted on her bra/tissue/pad has never bothered her. She has one child, who she reports having breast fed and no first degree relatives with a history of breast cancer. None of her prior biopsies have revealed malignant or premalignant lesions, and she denies any recent fever/chills, unintentional weight loss, N/V, CP, or SOB.  All of the above history was obtained with the assistance of a certified medical Spanish-English translator.  PAST MEDICAL HISTORY (PMH):  No past medical history on file.   PAST SURGICAL HISTORY (PSH):  Past Surgical History:  Procedure Laterality Date  . 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 Authorizing Provider  ibuprofen (ADVIL,MOTRIN) 600 MG tablet Take 1 tablet (600 mg total) by mouth every 8 (eight) hours as needed. 12/20/17  Yes Merrily Brittle, MD  lidocaine (LIDODERM) 5 % Place 1 patch onto the skin every 12 (twelve) hours. Remove & Discard patch within 12 hours or as directed by MD 12/20/17 12/20/18 Yes  Merrily Brittle, MD    ALLERGIES:  Allergies  Allergen Reactions  . Pineapple   . Shellfish Allergy     SOCIAL HISTORY:  Social History   Socioeconomic History  . Marital status: Married    Spouse name: Not on file  . Number of children: Not on file  . Years of education: Not on file  . Highest education level: Not on file  Occupational History  . Not on file  Social Needs  . Financial resource strain: Not on file  . Food insecurity:    Worry: Not on file    Inability: Not on file  . Transportation needs:    Medical: Not on file    Non-medical: Not on file  Tobacco Use  . Smoking status: Never Smoker  . Smokeless tobacco: Never Used  Substance and Sexual Activity  . Alcohol use: Never    Frequency: Never  . Drug use: Never  . Sexual activity: Yes  Lifestyle  . Physical activity:    Days per week: Not on file    Minutes per session: Not on file  . Stress: Not on file  Relationships  . Social connections:    Talks on phone: Not on file    Gets together: Not on file    Attends religious service: Not on file    Active member of club or organization: Not on file    Attends meetings of clubs or organizations: Not on file    Relationship status: Not on file  . Intimate partner violence:    Fear of current or ex partner: Not  on file    Emotionally abused: Not on file    Physically abused: Not on file    Forced sexual activity: Not on file  Other Topics Concern  . Not on file  Social History Narrative  . Not on file    The patient currently resides (home / rehab facility / nursing home): Home The patient normally is (ambulatory / bedbound): Ambulatory  FAMILY HISTORY:  Family History  Problem Relation Age of Onset  . Colon cancer Mother 7576  . Breast cancer Neg Hx     Otherwise negative/non-contributory.  REVIEW OF SYSTEMS:  Constitutional: denies any other weight loss, fever, chills, or sweats  Eyes: denies any other vision changes, history of eye injury   ENT: denies sore throat, hearing problems  Respiratory: denies shortness of breath, wheezing  Cardiovascular: denies chest pain, palpitations  Breasts: mass(es), nipple drainage, and pain as per HPI Gastrointestinal: denies abdominal pain, N/V, or diarrhea Musculoskeletal: denies any other joint pains or cramps  Skin: Denies any other rashes or skin discolorations Neurological: denies any other headache, dizziness, weakness  Psychiatric: Denies any other depression, anxiety   All other review of systems were otherwise negative   VITAL SIGNS:  BP 121/83   Pulse 72   Temp 97.9 F (36.6 C)   Ht 5\' 8"  (1.727 m)   Wt 178 lb (80.7 kg)   LMP 10/20/2018   SpO2 96%   BMI 27.06 kg/m   PHYSICAL EXAM:  Constitutional:  -- Normal body habitus  -- Awake, alert, and oriented x3  Eyes:  -- Pupils equally round and reactive to light  -- No scleral icterus  Ear, nose, throat:  -- No jugular venous distension -- No nasal drainage, bleeding Pulmonary:  -- No crackles  -- Equal breath sounds bilaterally -- Breathing non-labored at rest Cardiovascular:  -- S1, S2 present  -- No pericardial rubs  Breasts: -- Unable to appreciate any nipple discharge bilaterally, no nipple inversion or skin retraction -- Easily palpable non-tender to palpation 3.5 cm (wide) x 2 cm (tall) Left inferior breast mass at ~5 - 6 o'clock position -- No breast tenderness to palpation, no Right breast mass(es), and no axillary lymphadenopathy appreciated Gastrointestinal:  -- Abdomen soft, nontender, non-distended, no guarding/rebound  -- No abdominal masses appreciated, pulsatile or otherwise  Musculoskeletal and Integumentary:  -- Wounds or skin discoloration: None appreciated -- Extremities: B/L UE and LE FROM, hands and feet warm, no edema  Neurologic:  -- Motor function: Intact and symmetric -- Sensation: Intact and symmetric  Labs:  CBC Latest Ref Rng & Units 12/20/2017 09/11/2014  WBC 3.6 - 11.0 K/uL  4.6 9.7  Hemoglobin 12.0 - 16.0 g/dL 40.913.3 81.113.5  Hematocrit 91.435.0 - 47.0 % 38.8 41.0  Platelets 150 - 440 K/uL 347 319   CMP Latest Ref Rng & Units 12/20/2017 09/11/2014  Glucose 65 - 99 mg/dL 782(N130(H) 562(Z101(H)  BUN 6 - 20 mg/dL 13 12  Creatinine 3.080.44 - 1.00 mg/dL 6.570.90 8.460.91  Sodium 962135 - 145 mmol/L 140 138  Potassium 3.5 - 5.1 mmol/L 3.6 3.8  Chloride 101 - 111 mmol/L 109 104  CO2 22 - 32 mmol/L 21(L) 28  Calcium 8.9 - 10.3 mg/dL 9.2 8.6  Total Protein 6.5 - 8.1 g/dL 9.0(H) 9.1(H)  Total Bilirubin 0.3 - 1.2 mg/dL 0.6 0.4  Alkaline Phos 38 - 126 U/L 44 59  AST 15 - 41 U/L 33 22  ALT 14 - 54 U/L 26 41   Imaging studies:  Bilateral Diagnostic Mammogram and Targeted Ultrasound (09/06/2018) 1. Indeterminate hypoechoic mass within the LEFT breast at the 5 o'clock axis, 4 cm from the nipple, measuring 2.6 cm, corresponding to the palpable area of concern. This may represent a fibroadenoma or phyllodes. Recommend ultrasound-guided biopsy to ensure benignity. 2. No evidence of malignancy within the RIGHT breast. 3. Patient describes current RIGHT-sided clear nipple discharge, and previous LEFT-sided nipple discharge. This is somewhat suspicious and, per protocol, breast MRI is recommended to exclude associated malignancy.  Bilateral Breast MRI (10/11/2018) 1. Oval circumscribed heterogeneously enhancing mass within the lower outer quadrant of the LEFT breast, at middle depth, measuring 2.4 cm, compatible with fibroadenoma or phyllodes and corresponding  to the indeterminate mass described on recent mammogram and  ultrasound report of 09/06/2018. Ultrasound-guided biopsy is  recommended to ensure benignity. 2. No MRI evidence of malignancy within the RIGHT breast. Numerous benign cysts within the RIGHT breast, presumed fibrocystic change. 3. Causes of unilateral nipple discharge include: Hormonal changes, fibrocystic changes, benign papilloma, abscess/mastitis, birth control pills,  endocrine disorders, injury/trauma to breast, duct ectasia, medications, prolactinoma, and breast cancer. As is evident from this list, nipple discharge often stems from a benign condition, however, breast cancer is a possibility when unilateral spontaneous persistent single duct discharge (especially bloody or clear discharge) is present.  Ultrasound-guided Left Breast Biopsy (10/20/2018) Ultrasound guided biopsy of a left breast mass at 5:00.  No apparent complications.  Assessment/Plan:  47 y.o. female with enlarging Left inferior breast mass and chronic cyclical intermittent bilateral clear non-bloody nipple discharge.   - imaging and pathology results discussed with patient   - with assistance of certified medical Spanish-English medical translator, all risks, benefits, and alternatives to excisional biopsy of enlarging Left inferior breast mass and Right vs B/L central duct excision for definitive diagnosis and definitive therapy (nipple discharge) were discussed with the patient, all of her questions were answered to her expressed satisfaction, patient expresses she wishes to proceed with excision of enlarging Left inferior breast mass only at this time, and informed consent was obtained accordingly.  - will plan for excisional biopsy of palpable enlarging Left inferior breast mass first week in January per patient request due to upcoming work schedule  - anticipate return to clinic 2 weeks following above elective procedure  - instructed to call if any questions or concerns  All of the above recommendations were discussed with the patient, and all of patient's questions were answered to her expressed satisfaction.  Thank you for the opportunity to participate in this patient's care.  -- Scherrie Gerlach Earlene Plater, MD, RPVI [place]: [facility] General Surgery - Partnering for exceptional care. Office: [phone]

## 2018-10-28 ENCOUNTER — Telehealth: Payer: Self-pay

## 2018-10-28 NOTE — Telephone Encounter (Signed)
Patient notified she will pre admit at the hospital on 11/04/18 at 2:00 pm.

## 2018-11-01 ENCOUNTER — Encounter: Payer: Self-pay | Admitting: Advanced Practice Midwife

## 2018-11-04 ENCOUNTER — Other Ambulatory Visit: Payer: Self-pay

## 2018-11-04 ENCOUNTER — Encounter
Admission: RE | Admit: 2018-11-04 | Discharge: 2018-11-04 | Disposition: A | Discharge status: Home / Self Care | Payer: Self-pay | Source: Ambulatory Visit | Attending: Surgery | Admitting: Surgery

## 2018-11-04 DIAGNOSIS — Z01812 Encounter for preprocedural laboratory examination: Secondary | ICD-10-CM | POA: Insufficient documentation

## 2018-11-04 HISTORY — DX: Inflammatory liver disease, unspecified: K75.9

## 2018-11-04 LAB — CBC WITH DIFFERENTIAL/PLATELET
Abs Immature Granulocytes: 0.01 10*3/uL (ref 0.00–0.07)
Basophils Absolute: 0 10*3/uL (ref 0.0–0.1)
Basophils Relative: 1 %
Eosinophils Absolute: 0.1 10*3/uL (ref 0.0–0.5)
Eosinophils Relative: 1 %
HEMATOCRIT: 36.7 % (ref 36.0–46.0)
Hemoglobin: 12.2 g/dL (ref 12.0–15.0)
IMMATURE GRANULOCYTES: 0 %
Lymphocytes Relative: 31 %
Lymphs Abs: 1.7 10*3/uL (ref 0.7–4.0)
MCH: 30 pg (ref 26.0–34.0)
MCHC: 33.2 g/dL (ref 30.0–36.0)
MCV: 90.2 fL (ref 80.0–100.0)
Monocytes Absolute: 0.6 10*3/uL (ref 0.1–1.0)
Monocytes Relative: 11 %
Neutro Abs: 3.1 10*3/uL (ref 1.7–7.7)
Neutrophils Relative %: 56 %
Platelets: 281 10*3/uL (ref 150–400)
RBC: 4.07 MIL/uL (ref 3.87–5.11)
RDW: 12.8 % (ref 11.5–15.5)
WBC: 5.5 10*3/uL (ref 4.0–10.5)
nRBC: 0 % (ref 0.0–0.2)

## 2018-11-04 LAB — BASIC METABOLIC PANEL
Anion gap: 7 (ref 5–15)
BUN: 13 mg/dL (ref 6–20)
CO2: 22 mmol/L (ref 22–32)
Calcium: 9.1 mg/dL (ref 8.9–10.3)
Chloride: 109 mmol/L (ref 98–111)
Creatinine, Ser: 0.68 mg/dL (ref 0.44–1.00)
GFR calc Af Amer: >60 mL/min (ref >60)
GFR calc non Af Amer: >60 mL/min (ref >60)
Glucose, Bld: 106 mg/dL — ABNORMAL HIGH (ref 70–99)
Potassium: 3.4 mmol/L — ABNORMAL LOW (ref 3.5–5.1)
Sodium: 138 mmol/L (ref 135–145)

## 2018-11-04 NOTE — Patient Instructions (Signed)
Your procedure is scheduled on: Friday 11/18/18 Su procedimiento est programado para: Report to Day Surgery Desk [facility]nd [facility] Presntese a: To find out your arrival time please call [phone] between 1PM - 3PM on Thursday 11/17/18. Para saber su hora de llegada por favor llame al [phone] entre la 1PM - 3PM el da:  Remember: Instructions that are not followed completely may result in serious medical risk, up to and including death, or upon the discretion of your surgeon and anesthesiologist your surgery may need to be rescheduled.  Recuerde: Las instrucciones que no se siguen completamente Armed forces logistics/support/administrative officerpueden resultar en un riesgo de salud grave, incluyendo hasta la [place] o a discrecin de su cirujano y Scientific laboratory techniciananestesilogo, su ciruga se puede posponer.   __X__ 1. Do not eat food or drink liquids after midnight. No gum chewing or hard candies.  No coma alimentos ni tome lquidos despus de la medianoche.  No mastique chicle ni caramelos  duros.     __X__ 2. No alcohol for 24 hours before or after surgery.    No tome alcohol durante las 24 horas antes ni despus de la [place].   ____ 3. Bring all medications with you on the day of surgery if instructed.    Lleve todos los medicamentos con usted el da de su ciruga si se le ha indicado as.   __X__ 4. Notify your doctor if there is any change in your medical condition (cold, fever,                             infections).    Informe a su mdico si hay algn cambio en su condicin mdica (resfriado, fiebre, infecciones).   Do not wear jewelry, make-up, hairpins, clips or nail polish.  No use joyas, maquillajes, pinzas/ganchos para el cabello ni esmalte de uas.  Do not wear lotions, powders, or perfumes. .  No use lociones, polvos o perfumes.  .    Do not shave 48 hours prior to surgery. Men may shave face and neck.  No se afeite 48 horas antes de la [place].  Los hombres pueden Commercial Metals Companyafeitarse la cara y el cuello.   Do  not bring valuables to the hospital.   No lleve objetos de valor al hospital.  [facility] not [facility] any belongings or valuables.  [place] no se hace responsable de ningn tipo de pertenencias u objetos de Licensed conveyancervalor.               Contacts, dentures or bridgework may not be worn into surgery.  Los lentes de [place] Citycontacto, las dentaduras postizas o puentes no se pueden usar en la [place].  Leave your suitcase in the car. After surgery it may be brought to your room.  Deje su maleta en el auto.  Despus de la ciruga podr traerla a su habitacin.  For patients admitted to the hospital, discharge time is determined by your treatment team.  Para los pacientes que sean ingresados al hospital, el tiempo en el cual se le dar de alta es determinado por su                equipo de [place] Hollowtratamiento.   Patients discharged the day of surgery will not be allowed to drive home. A los pacientes que se les da de alta el mismo da de la ciruga no se les permitir conducir a Higher education careers advisercasa.   Please read over  the following fact sheets that you were given: Por favor lea las siguientes hojas de informacin que le dieron:    __X__ Take these medicines the morning of surgery with A SIP OF WATER:          Tome estas medicinas la maana de la ciruga con UN SORBO DE AGUA:  1. none  2.   3.   4.       5.  6.  ____ Fleet Enema (as directed)          Enema de Fleet (segn lo indicado)    __x__ Use CHG Soap as directed          Utilice el jabn de CHG segn lo indicado  ____ Use inhalers on the day of surgery          Use los inhaladores el da de la ciruga  ____ Stop metformin 2 days prior to surgery          Deje de tomar el metformin 2 das antes de la ciruga    ____ Take 1/2 of usual insulin dose the night before surgery and none on the morning of surgery           Tome la mitad de la dosis habitual de insulina la noche antes de la [place] y no tome nada en la maana de la              ciruga  ____ Stop Coumadin/Plavix/aspirin on           Deje de tomar el Coumadin/Plavix/aspirina el da:  __x__ Stop Anti-inflammatories on 11/11/18 (ibuprofen)          Deje de tomar antiinflamatorios el da:   ____ Stop supplements until after surgery            Deje de tomar suplementos hasta despus de la ciruga  ____ Bring C-Pap to the hospital          Lleve el C-Pap al hospital

## 2018-11-16 HISTORY — PX: BREAST EXCISIONAL BIOPSY: SUR124

## 2018-11-17 MED ORDER — CEFAZOLIN SODIUM-DEXTROSE 2-4 GM/100ML-% IV SOLN
2.0000 g | INTRAVENOUS | Status: AC
  Administered 2018-11-18: 2 g via INTRAVENOUS

## 2018-11-18 ENCOUNTER — Encounter: Payer: Self-pay | Admitting: Surgery

## 2018-11-18 ENCOUNTER — Ambulatory Visit: Payer: Self-pay | Admitting: Anesthesiology

## 2018-11-18 ENCOUNTER — Other Ambulatory Visit: Payer: Self-pay

## 2018-11-18 ENCOUNTER — Ambulatory Visit
Admission: RE | Admit: 2018-11-18 | Discharge: 2018-11-18 | Disposition: A | Discharge status: Home / Self Care | Payer: Self-pay | Source: Ambulatory Visit | Attending: Surgery | Admitting: Surgery

## 2018-11-18 ENCOUNTER — Encounter
Admission: RE | Disposition: A | Discharge status: Home / Self Care | Payer: Self-pay | Source: Ambulatory Visit | Attending: Surgery

## 2018-11-18 DIAGNOSIS — N6320 Unspecified lump in the left breast, unspecified quadrant: Secondary | ICD-10-CM

## 2018-11-18 DIAGNOSIS — N6323 Unspecified lump in the left breast, lower outer quadrant: Secondary | ICD-10-CM

## 2018-11-18 HISTORY — PX: BREAST BIOPSY: SHX20

## 2018-11-18 LAB — POCT PREGNANCY, URINE: Preg Test, Ur: NEGATIVE

## 2018-11-18 SURGERY — BREAST BIOPSY
Anesthesia: General | Site: Breast | Laterality: Left

## 2018-11-18 MED ORDER — CHLORHEXIDINE GLUCONATE CLOTH 2 % EX PADS: 6.0000 | MEDICATED_PAD | Freq: Once | CUTANEOUS | Status: DC

## 2018-11-18 MED ORDER — DEXAMETHASONE SODIUM PHOSPHATE 10 MG/ML IJ SOLN
INTRAMUSCULAR | Status: DC | PRN
  Administered 2018-11-18: 10 mg via INTRAVENOUS

## 2018-11-18 MED ORDER — MIDAZOLAM HCL 2 MG/2ML IJ SOLN
INTRAMUSCULAR | Status: AC
  Filled 2018-11-18: qty 2

## 2018-11-18 MED ORDER — GLYCOPYRROLATE 0.2 MG/ML IJ SOLN
INTRAMUSCULAR | Status: DC | PRN
  Administered 2018-11-18: 0.2 mg via INTRAVENOUS

## 2018-11-18 MED ORDER — ACETAMINOPHEN 500 MG PO TABS
ORAL_TABLET | ORAL | Status: AC
  Administered 2018-11-18: 1000 mg via ORAL
  Filled 2018-11-18: qty 2

## 2018-11-18 MED ORDER — FENTANYL CITRATE (PF) 100 MCG/2ML IJ SOLN
INTRAMUSCULAR | Status: AC
  Filled 2018-11-18: qty 2

## 2018-11-18 MED ORDER — FENTANYL CITRATE (PF) 100 MCG/2ML IJ SOLN
INTRAMUSCULAR | Status: AC
  Administered 2018-11-18: 25 ug via INTRAVENOUS
  Filled 2018-11-18: qty 2

## 2018-11-18 MED ORDER — GLYCOPYRROLATE 0.2 MG/ML IJ SOLN
INTRAMUSCULAR | Status: AC
  Filled 2018-11-18: qty 1

## 2018-11-18 MED ORDER — DEXAMETHASONE SODIUM PHOSPHATE 10 MG/ML IJ SOLN
INTRAMUSCULAR | Status: AC
  Filled 2018-11-18: qty 1

## 2018-11-18 MED ORDER — LIDOCAINE HCL 1 % IJ SOLN
INTRAMUSCULAR | Status: DC | PRN
  Administered 2018-11-18: 10 mL

## 2018-11-18 MED ORDER — PROPOFOL 500 MG/50ML IV EMUL
INTRAVENOUS | Status: AC
  Filled 2018-11-18: qty 50

## 2018-11-18 MED ORDER — MIDAZOLAM HCL 5 MG/5ML IJ SOLN
INTRAMUSCULAR | Status: DC | PRN
  Administered 2018-11-18: 2 mg via INTRAVENOUS

## 2018-11-18 MED ORDER — CEFAZOLIN SODIUM-DEXTROSE 2-4 GM/100ML-% IV SOLN
INTRAVENOUS | Status: AC
  Filled 2018-11-18: qty 100

## 2018-11-18 MED ORDER — PHENYLEPHRINE 40 MCG/ML (10ML) SYRINGE FOR IV PUSH (FOR BLOOD PRESSURE SUPPORT)
PREFILLED_SYRINGE | INTRAVENOUS | Status: DC | PRN
  Administered 2018-11-18 (×2): 100 ug via INTRAVENOUS

## 2018-11-18 MED ORDER — DEXMEDETOMIDINE HCL 200 MCG/2ML IV SOLN
INTRAVENOUS | Status: DC | PRN
  Administered 2018-11-18: 12 ug via INTRAVENOUS

## 2018-11-18 MED ORDER — LIDOCAINE 2% (20 MG/ML) 5 ML SYRINGE
INTRAMUSCULAR | Status: DC | PRN
  Administered 2018-11-18: 100 mg via INTRAVENOUS

## 2018-11-18 MED ORDER — FENTANYL CITRATE (PF) 100 MCG/2ML IJ SOLN
25.0000 ug | INTRAMUSCULAR | Status: DC | PRN
  Administered 2018-11-18 (×2): 25 ug via INTRAVENOUS

## 2018-11-18 MED ORDER — OXYCODONE-ACETAMINOPHEN 5-325 MG PO TABS: 1.0000 | ORAL_TABLET | ORAL | 0 refills | Status: AC | PRN

## 2018-11-18 MED ORDER — FAMOTIDINE 20 MG PO TABS
ORAL_TABLET | ORAL | Status: AC
  Administered 2018-11-18: 20 mg via ORAL
  Filled 2018-11-18: qty 1

## 2018-11-18 MED ORDER — LIDOCAINE HCL (PF) 2 % IJ SOLN
INTRAMUSCULAR | Status: AC
  Filled 2018-11-18: qty 10

## 2018-11-18 MED ORDER — ONDANSETRON HCL 4 MG/2ML IJ SOLN
4.0000 mg | Freq: Once | INTRAMUSCULAR | Status: AC | PRN
  Administered 2018-11-18: 4 mg via INTRAVENOUS

## 2018-11-18 MED ORDER — BUPIVACAINE HCL (PF) 0.5 % IJ SOLN
INTRAMUSCULAR | Status: AC
  Filled 2018-11-18: qty 30

## 2018-11-18 MED ORDER — ONDANSETRON HCL 4 MG/2ML IJ SOLN
INTRAMUSCULAR | Status: AC
  Filled 2018-11-18: qty 2

## 2018-11-18 MED ORDER — BUPIVACAINE HCL (PF) 0.5 % IJ SOLN
INTRAMUSCULAR | Status: DC | PRN
  Administered 2018-11-18: 10 mL

## 2018-11-18 MED ORDER — PROPOFOL 10 MG/ML IV BOLUS
INTRAVENOUS | Status: DC | PRN
  Administered 2018-11-18: 200 mg via INTRAVENOUS

## 2018-11-18 MED ORDER — LACTATED RINGERS IV SOLN
INTRAVENOUS | Status: DC
  Administered 2018-11-18: 08:00:00 via INTRAVENOUS

## 2018-11-18 MED ORDER — FAMOTIDINE 20 MG PO TABS
20.0000 mg | ORAL_TABLET | Freq: Once | ORAL | Status: AC
  Administered 2018-11-18: 20 mg via ORAL

## 2018-11-18 MED ORDER — OXYCODONE-ACETAMINOPHEN 5-325 MG PO TABS
1.0000 | ORAL_TABLET | ORAL | Status: DC | PRN
  Administered 2018-11-18: 1 via ORAL

## 2018-11-18 MED ORDER — OXYCODONE-ACETAMINOPHEN 5-325 MG PO TABS
ORAL_TABLET | ORAL | Status: AC
  Filled 2018-11-18: qty 1

## 2018-11-18 MED ORDER — ACETAMINOPHEN 500 MG PO TABS
1000.0000 mg | ORAL_TABLET | ORAL | Status: AC
  Administered 2018-11-18: 1000 mg via ORAL

## 2018-11-18 MED ORDER — ONDANSETRON HCL 4 MG/2ML IJ SOLN
INTRAMUSCULAR | Status: DC | PRN
  Administered 2018-11-18: 4 mg via INTRAVENOUS

## 2018-11-18 MED ORDER — FENTANYL CITRATE (PF) 100 MCG/2ML IJ SOLN
INTRAMUSCULAR | Status: DC | PRN
  Administered 2018-11-18: 25 ug via INTRAVENOUS
  Administered 2018-11-18 (×2): 50 ug via INTRAVENOUS
  Administered 2018-11-18: 25 ug via INTRAVENOUS

## 2018-11-18 MED ORDER — SEVOFLURANE IN SOLN
RESPIRATORY_TRACT | Status: AC
  Filled 2018-11-18: qty 250

## 2018-11-18 SURGICAL SUPPLY — 32 items
BLADE SURG 15 STRL LF DISP TIS (BLADE) ×1 IMPLANT
BLADE SURG 15 STRL SS (BLADE) ×2
CANISTER SUCT 1200ML W/VALVE (MISCELLANEOUS) ×3 IMPLANT
CHLORAPREP W/TINT 26ML (MISCELLANEOUS) ×3 IMPLANT
COVER PROBE FLX POLY STRL (MISCELLANEOUS) IMPLANT
COVER WAND RF STERILE (DRAPES) ×3 IMPLANT
DERMABOND ADVANCED (GAUZE/BANDAGES/DRESSINGS) ×2
DERMABOND ADVANCED .7 DNX12 (GAUZE/BANDAGES/DRESSINGS) ×1 IMPLANT
DEVICE DUBIN SPECIMEN MAMMOGRA (MISCELLANEOUS) IMPLANT
DRAPE LAPAROTOMY TRNSV 106X77 (MISCELLANEOUS) ×3 IMPLANT
ELECT CAUTERY BLADE 6.4 (BLADE) ×3 IMPLANT
ELECT REM PT RETURN 9FT ADLT (ELECTROSURGICAL) ×3
ELECTRODE REM PT RTRN 9FT ADLT (ELECTROSURGICAL) ×1 IMPLANT
GLOVE BIO SURGEON STRL SZ7 (GLOVE) ×3 IMPLANT
GLOVE INDICATOR 7.5 STRL GRN (GLOVE) ×3 IMPLANT
GOWN STRL REUS W/ TWL LRG LVL3 (GOWN DISPOSABLE) ×1 IMPLANT
GOWN STRL REUS W/ TWL XL LVL3 (GOWN DISPOSABLE) ×1 IMPLANT
GOWN STRL REUS W/TWL LRG LVL3 (GOWN DISPOSABLE) ×2
GOWN STRL REUS W/TWL XL LVL3 (GOWN DISPOSABLE) ×2
LABEL OR SOLS (LABEL) ×3 IMPLANT
MARGIN MAP 10MM (MISCELLANEOUS) IMPLANT
NEEDLE HYPO 25X1 1.5 SAFETY (NEEDLE) ×3 IMPLANT
PACK BASIN MINOR ARMC (MISCELLANEOUS) ×3 IMPLANT
SUT MNCRL 4-0 (SUTURE) ×2
SUT MNCRL 4-0 27XMFL (SUTURE) ×1
SUT SILK 2 0 SH (SUTURE) ×3 IMPLANT
SUT VIC AB 3-0 SH 27 (SUTURE) ×2
SUT VIC AB 3-0 SH 27X BRD (SUTURE) ×1 IMPLANT
SUTURE MNCRL 4-0 27XMF (SUTURE) ×1 IMPLANT
SYR 10ML LL (SYRINGE) ×3 IMPLANT
SYR BULB 3OZ (MISCELLANEOUS) ×3 IMPLANT
WATER STERILE IRR 1000ML POUR (IV SOLUTION) ×3 IMPLANT

## 2018-11-18 NOTE — Anesthesia Procedure Notes (Signed)
Procedure Name: LMA Insertion Date/Time: 11/18/2018 10:58 AM Performed by: Paulette Blanch, CRNA Pre-anesthesia Checklist: Patient identified, Patient being monitored, Timeout performed, Emergency Drugs available and Suction available Patient Re-evaluated:Patient Re-evaluated prior to induction Oxygen Delivery Method: Circle system utilized Preoxygenation: Pre-oxygenation with 100% oxygen Induction Type: IV induction Ventilation: Mask ventilation without difficulty LMA: LMA inserted LMA Size: 4.0 Tube type: Oral Number of attempts: 1 Placement Confirmation: positive ETCO2 and breath sounds checked- equal and bilateral Tube secured with: Tape Dental Injury: Teeth and Oropharynx as per pre-operative assessment

## 2018-11-18 NOTE — Anesthesia Preprocedure Evaluation (Signed)
Anesthesia Evaluation  Patient identified by MRN, date of birth, ID band Patient awake    Reviewed: Allergy & Precautions, H&P , NPO status , Patient's Chart, lab work & pertinent test results, reviewed documented beta blocker date and time   Airway Mallampati: II  TM Distance: >3 FB Neck ROM: full    Dental  (+) Teeth Intact   Pulmonary neg pulmonary ROS,    Pulmonary exam normal        Cardiovascular Exercise Tolerance: Good negative cardio ROS Normal cardiovascular exam Rate:Normal     Neuro/Psych negative neurological ROS  negative psych ROS   GI/Hepatic negative GI ROS, (+) Hepatitis -  Endo/Other  negative endocrine ROS  Renal/GU negative Renal ROS  negative genitourinary   Musculoskeletal   Abdominal   Peds  Hematology negative hematology ROS (+)   Anesthesia Other Findings   Reproductive/Obstetrics negative OB ROS                             Anesthesia Physical Anesthesia Plan  ASA: II  Anesthesia Plan: General LMA   Post-op Pain Management:    Induction:   PONV Risk Score and Plan:   Airway Management Planned:   Additional Equipment:   Intra-op Plan:   Post-operative Plan:   Informed Consent: I have reviewed the patients History and Physical, chart, labs and discussed the procedure including the risks, benefits and alternatives for the proposed anesthesia with the patient or authorized representative who has indicated his/her understanding and acceptance.     Plan Discussed with: CRNA  Anesthesia Plan Comments:         Anesthesia Quick Evaluation

## 2018-11-18 NOTE — Anesthesia Post-op Follow-up Note (Signed)
Anesthesia QCDR form completed.        

## 2018-11-18 NOTE — Discharge Instructions (Addendum)
In addition to included general post-operative instructions for Left Breast Lumpectomy / Excisional Biopsy,  Diet: Resume home heart healthy diet.   Activity: No heavy lifting >15 - 20 pounds (children, pets, laundry, garbage) or strenuous activity until follow-up, but light activity and walking are encouraged. Do not drive or drink alcohol if taking narcotic pain medications.  Wound care: 2 days after surgery (Sunday, 1/5), you may shower/get incision wet with soapy water and pat dry (do not rub incisions), but no baths or submerging incision underwater until follow-up.   Medications: Resume all home medications. For mild to moderate pain: acetaminophen (Tylenol) or ibuprofen/naproxen (if no kidney disease). Combining Tylenol with alcohol can substantially increase your risk of causing liver disease. Narcotic pain medications, if prescribed, can be used for severe pain, though may cause nausea, constipation, and drowsiness. Do not combine Tylenol and Percocet (or similar) within a 6 hour period as Percocet (and similar) contain(s) Tylenol. If you do not need the narcotic pain medication, you do not need to fill the prescription.  Call office [phone]) at any time if any questions, worsening pain, fevers/chills, bleeding, drainage from incision site, or other concerns.  AMBULATORY SURGERY  DISCHARGE INSTRUCTIONS   1) The drugs that you were given will stay in your system until tomorrow so for the next 24 hours you should not:  A) Drive an automobile B) Make any legal decisions C) Drink any alcoholic beverage   2) You may resume regular meals tomorrow.  Today it is better to start with liquids and gradually work up to solid foods.  You may eat anything you prefer, but it is better to start with liquids, then soup and crackers, and gradually work up to solid foods.   3) Please notify your doctor immediately if you have any unusual bleeding, trouble breathing, redness and pain at the  surgery site, drainage, fever, or pain not relieved by medication.    4) Additional Instructions:        Please contact your physician with any problems or Same Day Surgery at [phone], Monday through Friday 6 am to 4 pm, or [place] [facility] Main number at [phone].   CIRUGIA AMBULATORIA       Instruccionnes de alta      1.  Las drogas que se Dispensing opticianle administraron permaneceran en su cuerpo PG[employer] Corporationhasta [place], asi            que por las proximas 24 horas usted no debe:   Conducir Field seismologist(manejar) un automovil   Hacer ninguna decision legal   Tomar ninguna bebida alcoholica  2.  A) [place] puede comenzar una dieta regular.  Es mejor que hoy empiece con           liquidos y gradualmente anada [address] solid[address].       B) Puede comer cualquier comida que desee pero es mejor empezar con liquidos,                      luego sopitas con galletas saladas y gradualmente llegar a las comidas solidas.  3.  Por favor avise a su medico inmediatamente si usted tiene algun sangrado anormal,       tiene dificultad con la respiracion, enrojecimiento y Engineer, miningdolor en el sitio de la cirugia, [place]ado Resortdrenaje,       fiebro o dolor que se alivia con [facility].

## 2018-11-18 NOTE — Interval H&P Note (Signed)
History and Physical Interval Note:  11/18/2018 10:18 AM  Kristen Kim  has presented today for surgery, with the diagnosis of LEFT BREAST MASS  The various methods of treatment have been discussed with the patient and family. After consideration of risks, benefits and other options for treatment, the patient has consented to  Procedure(s): EXCISIONAL  BIOPSY LEFT BREAST MASS (Left) as a surgical intervention .  The patient's history has been reviewed, patient examined, no change in status, stable for surgery.  I have reviewed the patient's chart and labs.  Questions were answered to the patient's satisfaction.     Ancil Linsey

## 2018-11-18 NOTE — Transfer of Care (Signed)
Immediate Anesthesia Transfer of Care Note  Patient: Kristen Kim  Procedure(s) Performed: EXCISIONAL  BIOPSY LEFT BREAST MASS (Left Breast)  Patient Location: PACU  Anesthesia Type:General  Level of Consciousness: awake, alert  and oriented  Airway & Oxygen Therapy: Patient Spontanous Breathing and Patient connected to face mask oxygen  Post-op Assessment: Report given to RN and Post -op Vital signs reviewed and stable  Post vital signs: Reviewed and stable  Last Vitals:  Vitals Value Taken Time  BP    Temp    Pulse    Resp    SpO2      Last Pain:  Vitals:   11/18/18 0825  TempSrc: Tympanic  PainSc: 0-No pain         Complications: No apparent anesthesia complications

## 2018-11-18 NOTE — Op Note (Signed)
SURGICAL OPERATIVE REPORT  DATE OF PROCEDURE: 11/18/2018  ATTENDING Surgeon(s): Ancil Linsey, MD  ANESTHESIA: General  PRE-OPERATIVE DIAGNOSIS: Chronic enlarging easily palpable Left lower breast mass with B/L nipple discharge (icd-10: N63.23)  POST-OPERATIVE DIAGNOSIS: Chronic enlarging easily palpable Left lower breast mass with B/L nipple discharge (icd-10: N63.23)  PROCEDURE(S):  1.) Left partial mastectomy/lumpectomy (cpt: 19301)  INTRAOPERATIVE FINDINGS: Easily palpable 9 cm x 4 cm Left breast mass around the 3 - 5 o'clock position, removed in its entirety, including additional 3 cm x 3 cm superolateral margin and 6 cm x 3 cm lateral margin  INTRAVENOUS FLUIDS: 1000 mL crystalloid   ESTIMATED BLOOD LOSS: Minimal (<20 mL)  URINE OUTPUT: No Foley   SPECIMENS: Left breast lumpectomy/partial mastectomy  IMPLANTS: None  DRAINS: None  COMPLICATIONS: None apparent  CONDITION AT END OF PROCEDURE: Hemodynamically stable and extubated  DISPOSITION OF PATIENT: PACU  INDICATIONS FOR PROCEDURE:  48 y.o. femalepresented to outpatient surgical office for evaluation of her easily palpable enlarging Left lower breast mass with B/L nipple discharge (transiently bloody only from Right breast). Patient reports she noted her Left lower breast mass ~2 years ago, for which she last year underwent core needle biopsy, since which it has grown significantly in size and again recently underwent second core needle biopsy. She denies any pain associated with the mass. She also reports a >3 year history of intermittent Right > Left breast thin clear slightly milky nipple discharge that occurs cyclically during her menstrual periods, at which time she says her breasts also swell and may be somewhat sore as well. None of her prior biopsies revealed malignant or premalignant lesions, but due to persistent and enlarging Left lower breast mass, excision was advised, for which surgical  consultation was requested. All risks, benefits, and alternatives to lumpectomy vs mastectomy were discussed with the patient, all of patient's questions were answered to her expressed satisfaction, patient elected to proceed with lumpectomy/partial mastectomy, and informed consent was accordingly obtained and documented at that time.  DETAILS OF PROCEDURE: Patient was brought to the operating suite and appropriately identified. General anesthesia was administered along with appropriate pre-operative antibiotics, and LMA was placed by anesthetist. In supine position, operative site was prepped and draped in the usual sterile fashion, and following a brief time out, peri-areolar local anesthetic was injected as well as surrounding patient's Left breast mass, and a Left lateral breast circumareolar incision was made using a #15 blade scalpel. Appropriate thickness skin flaps were developed, and excision was continued deep to chest wall including the mass in its entirety with appropriately wide margins. Long lateral silk suture and short superior silk suture were used to orient the specimen, which was then handed off the field for pathology processing. Additional firm fibrotic superolateral and lateral margins were excised similarly and likewise handed off of the field for pathology processing and analysis. Hemostasis was achieved, additional local anesthetic was injected, and the wound was re-approximated in layers using buried interrupted 3-0 Vicryl for dermis and 4-0 Monocryl running subcuticular suture, after which skin was then cleaned and dried, and sterile Dermabond skin glue was applied and allowed to dry.  Patient was then safely able to be extubated, awakened, and transferred to PACU for post-operative monitoring and care.  I was present for all aspects of the above procedure, and no operative complications were apparent.

## 2018-11-21 ENCOUNTER — Encounter: Payer: Self-pay | Admitting: *Deleted

## 2018-11-21 LAB — SURGICAL PATHOLOGY

## 2018-11-21 NOTE — Progress Notes (Signed)
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  She is to return in one year with annual screening unless otherwise indicated by Dr. Earlene Plater.  HSIS to [place].

## 2018-11-22 NOTE — Anesthesia Postprocedure Evaluation (Signed)
Anesthesia Post Note  Patient: Kristen Kim  Procedure(s) Performed: EXCISIONAL  BIOPSY LEFT BREAST MASS (Left Breast)  Patient location during evaluation: PACU Anesthesia Type: General Level of consciousness: awake and alert Pain management: pain level controlled Vital Signs Assessment: post-procedure vital signs reviewed and stable Respiratory status: spontaneous breathing, nonlabored ventilation, respiratory function stable and patient connected to nasal cannula oxygen Cardiovascular status: stable and blood pressure returned to baseline Postop Assessment: no apparent nausea or vomiting Anesthetic complications: no     Last Vitals:  Vitals:   11/18/18 1401 11/18/18 1503  BP: 102/86 (!) 106/58  Pulse: 73 71  Resp: 18 18  Temp: (!) 36.3 C   SpO2: 97% 96%    Last Pain:  Vitals:   11/21/18 0835  TempSrc:   PainSc: 0-No pain                 Yevette Edwards

## 2018-12-01 ENCOUNTER — Other Ambulatory Visit: Payer: Self-pay

## 2018-12-01 ENCOUNTER — Encounter: Payer: Self-pay | Admitting: Surgery

## 2018-12-01 ENCOUNTER — Ambulatory Visit (INDEPENDENT_AMBULATORY_CARE_PROVIDER_SITE_OTHER): Payer: Self-pay | Admitting: Surgery

## 2018-12-01 VITALS — BP 109/73 | HR 83 | Temp 97.5°F | Ht 68.0 in | Wt 176.4 lb

## 2018-12-01 DIAGNOSIS — N6323 Unspecified lump in the left breast, lower outer quadrant: Secondary | ICD-10-CM

## 2018-12-01 DIAGNOSIS — Z4889 Encounter for other specified surgical aftercare: Secondary | ICD-10-CM

## 2018-12-01 NOTE — Progress Notes (Signed)
Surgical Clinic Progress/Follow-up Note   HPI:  48 y.o. Female presents to clinic for post-op follow-up 13 Days s/p Left breast lumpectomy Kristen Kim, 11/18/2018). Patient reports complete resolution of peri-operative pain and has been feeling well, denies any wound drainage, nipple discharge, N/V, fever/chills, CP, or SOB.  Review of Systems:  Constitutional: denies fever/chills  Respiratory: denies shortness of breath, wheezing  Cardiovascular: denies chest pain, palpitations  Breast: pain, wound, and nipple discharge as per interval history Skin: Denies any other rashes or skin discolorations except post-surgical wounds as per interval history  Vital Signs:  BP 109/73   Pulse 83   Temp (!) 97.5 F (36.4 C) (Temporal)   Ht 5\' 8"  (1.727 m)   Wt 176 lb 6.4 oz (80 kg)   SpO2 96%   BMI 26.82 kg/m    Physical Exam:  Constitutional:  -- Overweight body habitus  -- Awake, alert, and oriented x3  Pulmonary:  -- No crackles -- Equal breath sounds bilaterally -- Breathing non-labored at rest Cardiovascular:  -- S1, S2 present  -- No pericardial rubs  Breast:  -- Left breast post-surgical incision well-approximated without any peri-incisional erythema or drainage, non-tender to palpation Musculoskeletal / Integumentary:  -- Wounds or skin discoloration: None appreciated except post-surgical incisions as described above (Breast) -- Extremities: B/L UE and LE FROM, hands and feet warm, no edema   Imaging: No new pertinent imaging available for review  Surgical Pathology (11/18/2018):  A. BREAST, LEFT, MASS; EXCISION:  - FIBROADENOMA.  - ADJACENT BENIGN BREAST PARENCHYMA WITH FIBROCYSTIC CHANGE.  -MARGINS NEGATIVE FOR TUMOR.   B. BREAST, LEFT, SUPERIOR LATERAL MARGIN; EXCISION:  - BENIGN BREAST PARENCHYMA.  - MARGIN NEGATIVE FOR TUMOR.   C. BREAST, LEFT, LATERAL MARGIN; EXCISION:  -MINUTE INTRADUCTAL PAPILLOMA AND FIBROCYSTIC CHANGES.  - MARGIN NEGATIVE FOR TUMOR.  - PREVIOUS  BIOPSY SITE RELATED CHANGE.  Assessment:  48 y.o. yo Female with a problem list including...  Patient Active Problem List   Diagnosis Date Noted  . Mass of lower outer quadrant of left breast 10/27/2018  . Bilateral nipple discharge 10/27/2018    presents to clinic for post-op follow-up evaluation, doing well 13 Days s/p Left breast lumpectomy Kristen Kim, 11/18/2018).  Plan:              - pathology results discussed              - okay to submerge incisions under water (baths, swimming) prn             - gradually resume all activities without restrictions over next 2 weeks             - apply sunblock particularly to incisions with sun exposure to reduce pigmentation of scars             - return to clinic as needed, instructed to call office if any questions or concerns  All of the above recommendations were discussed with the patient, and all of patient's questions were answered to her expressed satisfaction.  -- Scherrie Gerlach Kristen Plater, MD, RPVI [place]: [facility] General Surgery - Partnering for exceptional care. Office: [phone]

## 2018-12-01 NOTE — Patient Instructions (Addendum)
Patient is to return to the office as needed. Continue to have yearly mammograms.  Call the office with any questions or concerns.   Fibroadenoma Fibroadenoma Un fibroadenoma es un bulto (tumor) en la mama. El bulto es benigno. Esto significa que no es cncer. Puede moverse debajo de la piel al tocarlo. Este tipo de tumor puede desarrollarse en una sola mama o en ambas. Se desconoce la causa de esta afeccin. Algunos bultos son demasiado pequeos para poder sentirlos. Otros pueden sentirse como bultos firmes, redondos, lisos y [place]. Este tipo de bulto puede tratarse con exmenes regulares de las [place]. Se le realizarn exmenes para detectar cambios en el tumor. En algunos casos, el tumor se extirpa. El tumor puede extirparse si:  Es grande.  Sigue creciendo.  Causa dolor o cambios en la piel de la mama.  La paciente es una nia adolescente. Los bultos en las nias pequeas tienden a crecer con Museum/gallery conservator. Siga estas indicaciones en su casa: Exmenes de mamas   Examnese las [employer] en su casa como se lo haya indicado el mdico. Informe cualquier cambio o inquietud que tenga. Preste atencin a lo siguiente: ? El tamao del tumor. ? La apariencia y la sensibilidad de la piel en las [place]. ? La apariencia y la sensibilidad de los pezones. Indicaciones generales  Si le extirparon un bulto, siga las indicaciones del mdico para el cuidado en el hogar despus de la [place].  No consuma ningn producto que contenga nicotina o tabaco, como cigarrillos y Administrator, Civil Service. Estos pueden aumentar su riesgo de Research officer, political party. Si necesita ayuda para dejar de fumar, consulte al mdico.  Concurra a todas las visitas de seguimiento como se lo haya indicado el mdico. Esto es importante. Necesitar realizarse exmenes mamarios regularmente. Comunquese con un mdico si:  El bulto cambia de tamao o se siente diferente.  El bulto comienza a Programmer, multimedia.  Encuentra un bulto  nuevo.  Nota cualquier cambio en la piel de la mama.  Nota cualquier cambio en el pezn.  Le sale lquido del pezn.  Tiene enrojecimiento alrededor del pezn. Resumen  Un fibroadenoma es un bulto (tumor) en la mama. El bulto es benigno. Esto significa que no es cncer.  Este tumor puede sentirse como un bulto firme, redondo, liso y [employer]. Algunos fibroadenomas son demasiado pequeos para poder sentirlos al tacto.  Tener esta afeccin puede aumentar ligeramente el riesgo de contraer cncer de mama en el futuro.  Examnese las [employer] en su casa. Est atenta a los [employer] del bulto. Adems, observe si presenta cambios en el aspecto y la sensibilidad de los pezones y la piel de las [place].  Comunquese con su mdico si el bulto se agranda o comienza a Publishing rights manager. Adems, informe al mdico si presenta si presenta cambios en el aspecto de los pezones y en la sensibilidad de la piel de las [place]. Esta informacin no tiene Theme park manager el consejo del mdico. Asegrese de hacerle al mdico cualquier pregunta que tenga. Document Released: 02/06/2011 Document Revised: 12/14/2017 Document Reviewed: 12/14/2017 Elsevier Interactive Patient Education  2019 [employer].

## 2019-05-29 ENCOUNTER — Telehealth: Payer: Self-pay | Admitting: *Deleted

## 2019-05-29 ENCOUNTER — Encounter: Payer: Self-pay | Admitting: *Deleted

## 2019-05-29 DIAGNOSIS — Z20822 Contact with and (suspected) exposure to covid-19: Secondary | ICD-10-CM

## 2019-05-29 NOTE — Telephone Encounter (Signed)
Received call from [facility] requesting COVID-19 testing for this pt. I set up a chart.  I scheduled her for 05/30/2019 at 8:15 at the [facility] [place] location..   I used the interpreter for [place] [phone] from [employer].  Order entered  Insur:   Continential

## 2019-05-30 ENCOUNTER — Other Ambulatory Visit: Payer: Self-pay

## 2019-05-30 ENCOUNTER — Encounter: Payer: Self-pay | Admitting: Surgery

## 2019-05-30 DIAGNOSIS — Z20822 Contact with and (suspected) exposure to covid-19: Secondary | ICD-10-CM

## 2019-06-01 ENCOUNTER — Ambulatory Visit: Payer: Self-pay | Admitting: *Deleted

## 2019-06-01 NOTE — Telephone Encounter (Signed)
Using [employer] for [place] [phone].   I called pt regarding her request for a note for work that she has been tested for COVID-19.  Her test result is not back yet. She is being followed by [facility].  Who referred her for testing for the virus.    I got her enrolled in Mychart so we could send her a generic letter for her employer.  She needs the letter even though her result is not back.   She needs proof she was tested.

## 2019-06-01 NOTE — Telephone Encounter (Signed)
This encounter was created in error - please disregard.

## 2019-06-03 LAB — NOVEL CORONAVIRUS, NAA: SARS-CoV-2, NAA: DETECTED — AB

## 2020-01-17 ENCOUNTER — Other Ambulatory Visit: Payer: Self-pay

## 2020-01-17 ENCOUNTER — Ambulatory Visit: Payer: Self-pay | Attending: Oncology

## 2020-01-17 ENCOUNTER — Ambulatory Visit
Admission: RE | Admit: 2020-01-17 | Discharge: 2020-01-17 | Disposition: A | Discharge status: Home / Self Care | Payer: Self-pay | Source: Ambulatory Visit | Attending: Oncology | Admitting: Oncology

## 2020-01-17 VITALS — BP 118/79 | HR 67 | Temp 97.5°F | Ht 67.0 in | Wt 183.0 lb

## 2020-01-17 DIAGNOSIS — Z0000 Encounter for general adult medical examination without abnormal findings: Secondary | ICD-10-CM | POA: Insufficient documentation

## 2020-01-17 NOTE — Progress Notes (Signed)
  Subjective:     Patient ID: Kristen Kim, female   DOB: Jun 18, 1971, 49 y.o.   MRN: [id number]  HPI   Review of Systems     Objective:   Physical Exam Chest:     Breasts:        Right: No swelling, bleeding, inverted nipple, mass, nipple discharge, skin change or tenderness.        Left: No swelling, bleeding, inverted nipple, mass, nipple discharge, skin change or tenderness.       Comments: fibroglandular issue bilateral   Biopsy scars    Assessment:     49 year old patient presents for [facility] clinic visit. History of bilateral breast biopsies with fibroadenoma results.  Patient screened, and meets [facility] eligibility.  Patient does not have insurance, Medicare or Medicaid. Instructed patient on breast self awareness using teach back method. Palpated bilateral fibroglandular breast tissue. Jaqui Laukaitis interpreted exam.  Risk Assessment    Risk Scores      01/17/2020 08/31/2018   Last edited by: Jim Like, RN Scarlett Presto, RN   5-year risk: 1.4 % 1.1 %   Lifetime risk: 11.6 % 9.6 %             Plan:     Sent for bilateral screening mammogram.

## 2020-01-18 ENCOUNTER — Other Ambulatory Visit: Payer: Self-pay

## 2020-01-18 DIAGNOSIS — N63 Unspecified lump in unspecified breast: Secondary | ICD-10-CM

## 2020-02-02 ENCOUNTER — Ambulatory Visit
Admission: RE | Admit: 2020-02-02 | Discharge: 2020-02-02 | Disposition: A | Discharge status: Home / Self Care | Payer: Self-pay | Source: Ambulatory Visit | Attending: Oncology | Admitting: Oncology

## 2020-02-02 DIAGNOSIS — N63 Unspecified lump in unspecified breast: Secondary | ICD-10-CM | POA: Insufficient documentation

## 2020-02-05 NOTE — Progress Notes (Signed)
Letter mailed from [facility] to notify of normal mammogram results.  Patient to return in one year for annual screening.  Copy to HSIS. 

## 2020-02-10 ENCOUNTER — Ambulatory Visit: Payer: Self-pay | Attending: Internal Medicine

## 2020-02-10 DIAGNOSIS — Z23 Encounter for immunization: Secondary | ICD-10-CM

## 2020-02-10 NOTE — Progress Notes (Signed)
   Covid-19 Vaccination Clinic  Name:  Kristen Kim    MRN: [id number] DOB: 1971-01-06  02/10/2020  [name] was observed post Covid-19 immunization for 15 minutes without incident. She was provided with Vaccine Information Sheet and instruction to access the V-Safe system.   [name] was instructed to call 911 with any severe reactions post vaccine: Marland Kitchen Difficulty breathing  . Swelling of face and throat  . A fast heartbeat  . A bad rash all over body  . Dizziness and weakness   Immunizations Administered    Name Date Dose VIS Date Route   Pfizer COVID-19 Vaccine 02/10/2020  5:30 PM 0.3 mL 10/27/2019 Intramuscular   Manufacturer: [employer], [employer]   Lot: [id number]   NDC: [address]-8

## 2020-03-02 ENCOUNTER — Ambulatory Visit: Payer: Self-pay | Attending: Internal Medicine

## 2020-03-02 DIAGNOSIS — Z23 Encounter for immunization: Secondary | ICD-10-CM

## 2020-03-02 NOTE — Progress Notes (Signed)
   Covid-19 Vaccination Clinic  Name:  Kristen Kim    MRN: [id number] DOB: [birth year]  03/02/2020  [name] was observed post Covid-19 immunization for 30 minutes based on pre-vaccination screening without incident. She was provided with Vaccine Information Sheet and instruction to access the V-Safe system.   [name] was instructed to call 911 with any severe reactions post vaccine: Marland Kitchen Difficulty breathing  . Swelling of face and throat  . A fast heartbeat  . A bad rash all over body  . Dizziness and weakness   Immunizations Administered    Name Date Dose VIS Date Route   Pfizer COVID-19 Vaccine 03/02/2020  5:14 PM 0.3 mL 10/27/2019 Intramuscular   Manufacturer: [employer], [employer]   Lot: [id number]   NDC: [address]-6

## 2020-03-02 NOTE — Progress Notes (Signed)
   Covid-19 Vaccination Clinic  Name:  Kristen Kim    MRN: [id number] DOB: Nov 09, 1971  03/02/2020  Kristen Kim was observed post Covid-19 immunization for 30 minutes based on pre-vaccination screening .  During the observation period, she experienced an adverse reaction with the following symptoms:  complained of scratchy throat.  Assessment : Time of assessment 1752. Alert and oriented and Anxious.  Actions taken:  Vitals sign taken  VAERS form obtained and completed by RN.  - 1752- BP- 129/84, P- 74, O2- 99%, recheck at 1804- BP- 122/90, P- 65, O2- 98%    Medications administered: Diphenhydramine (Benadryl) 25 mg capsule, by mouth. Time: 1750. Administered by RN.  Disposition: Reports no further symptoms of adverse reaction after observation for 45 minutes. Discharged home. The Patient was provided with Vaccine Information Sheet and instruction to access the V-Safe system.    Immunizations Administered    Name Date Dose VIS Date Route   Pfizer COVID-19 Vaccine 03/02/2020  5:14 PM 0.3 mL 10/27/2019 Intramuscular   Manufacturer: [employer], [employer]   Lot: [id number]   NDC: [address]-2

## 2020-03-07 ENCOUNTER — Telehealth: Payer: Self-pay | Admitting: *Deleted

## 2020-03-07 NOTE — Telephone Encounter (Signed)
Called and spoke with the patient through [employer] ID [phone] to see how she was doing after receiving her second COVID vaccine. She stated that she feels great and after they gave her some medicine her symptoms went away and she has had no other issues. Advised patient to call back with any questions or concerns and for advice before receiving any future COVID vaccine boosters. Patient verbalized understanding.

## 2020-09-29 IMAGING — MR MR BILATERAL BREAST WITHOUT AND WITH CONTRAST
2 of 9 series · 6 of 48 positions shown · IV contrast (Gadavist)
Comparison: Previous exam(s).

CLINICAL DATA: RIGHT-sided spontaneous clear nipple discharge,
ongoing/intermittent for 2-3 years. Additional previous LEFT-sided
nipple discharge, none currently.
TECHNIQUE: Multiplanar, multisequence MR images of both breasts were obtained
prior to and following the intravenous administration of 8 ml of
Gadavist

[Series 3: T1 · axial · B · 1.5mm · 1.02mm/px · z∈[-94,+49]mm · 5 of 96 slices shown]
[im 1/96]
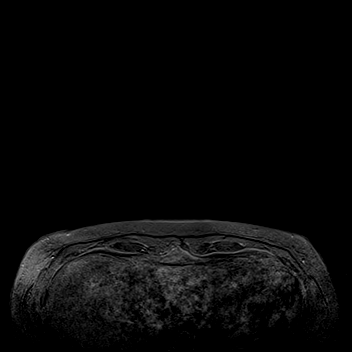
[im 24/96]
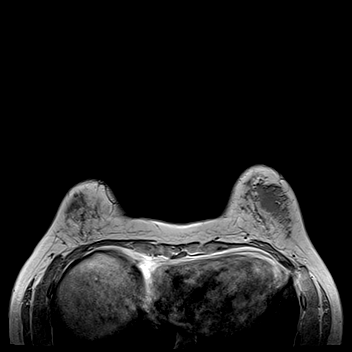
[im 48/96]
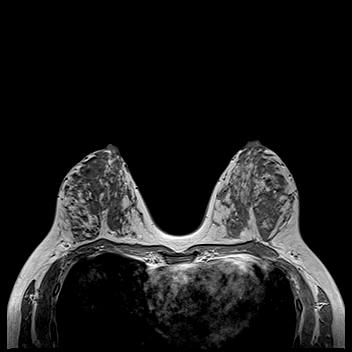
[im 72/96]
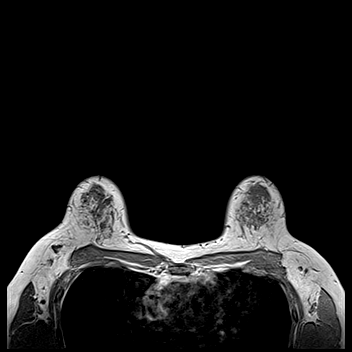
[im 96/96]
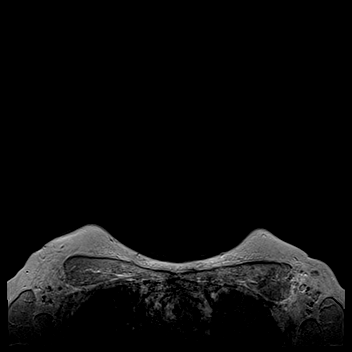

[Series 4: T2 · axial · B · 3.0mm · 0.97mm/px · 1 of 40 slices shown]
[im 1/40]
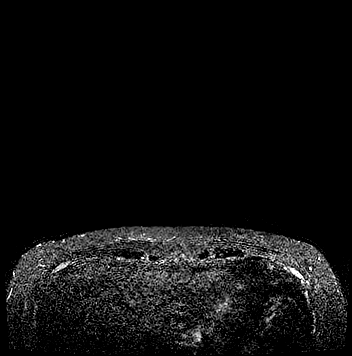

[6 of 48 positions shown; findings below may reference images not displayed]

Recent diagnostic ultrasound demonstrating an indeterminate mass in
the LEFT breast at the 5 o'clock axis, 4 cm from nipple, measuring
2.6 cm, corresponding to a palpable area of concern, with
ultrasound-guided biopsy recommended.

LABS:  Not applicable

EXAM:
BILATERAL BREAST MRI WITH AND WITHOUT CONTRAST
Three-dimensional MR images were rendered by post-processing of the
original MR data on an independent workstation. The
three-dimensional MR images were interpreted, and findings are
reported in the following complete MRI report for this study. Three
dimensional images were evaluated at the independent [employer]
workstation
FINDINGS: Breast composition: c. Heterogeneous fibroglandular tissue.

Background parenchymal enhancement: Moderate

Right breast: No suspicious enhancing mass, non-mass enhancement or
secondary signs of malignancy. Numerous benign cysts scattered
throughout the RIGHT breast.

Left breast: Oval circumscribed heterogeneously enhancing mass
within the lower outer quadrant of the LEFT breast, at middle depth,
measuring 2.4 cm, with mixed enhancement kinetics including plateau,
corresponding to the recent mammogram and ultrasound findings.

No additional enhancing mass, suspicious non-mass enhancement or
secondary signs of malignancy within the LEFT breast. Scattered
benign cysts. Isodense mass within the upper LEFT breast, with
associated biopsy clip, corresponding to a previously biopsied
fibroadenoma (ultrasound-guided biopsy of 05/01/2016).

Lymph nodes: No abnormal appearing lymph nodes.

Ancillary findings:  None.
IMPRESSION: 1. Oval circumscribed heterogeneously enhancing mass within the
lower outer quadrant of the LEFT breast, at middle depth, measuring
2.4 cm, corresponding to the indeterminate mass described on recent
mammogram and ultrasound report of 09/06/2018. Ultrasound-guided
biopsy is recommended to ensure benignity.
2. No MRI evidence of malignancy within the RIGHT breast. Numerous
benign cysts within the RIGHT breast, presumed fibrocystic change.
3. Causes of unilateral nipple discharge include: Hormonal changes,
fibrocystic changes, benign papilloma, abscess/mastitis, birth
control pills, endocrine disorders, injury/trauma to breast, duct
ectasia, medications, prolactinoma, and breast cancer. As is evident
from this list, nipple discharge often stems from a benign
condition, however, breast cancer is a possibility when unilateral
spontaneous persistent single duct discharge (especially bloody or
clear discharge) is present.

RECOMMENDATION:
1. Ultrasound-guided biopsy of the indeterminate LEFT breast mass at
the 5 o'clock axis. MRI appearance is compatible with fibroadenoma
or phyllodes. Ultrasound-guided biopsy is recommended to ensure
benignity.
2. Breast surgery consultation for possible central ductal excision
given the persistent nipple discharge without MRI or sonographic
correlate.

BI-RADS CATEGORY  4: Suspicious.

## 2021-01-22 ENCOUNTER — Ambulatory Visit: Payer: Self-pay

## 2021-01-29 ENCOUNTER — Ambulatory Visit: Payer: Self-pay | Attending: Oncology | Admitting: *Deleted

## 2021-01-29 ENCOUNTER — Ambulatory Visit
Admission: RE | Admit: 2021-01-29 | Discharge: 2021-01-29 | Disposition: A | Discharge status: Home / Self Care | Payer: Self-pay | Source: Ambulatory Visit | Attending: Oncology | Admitting: Oncology

## 2021-01-29 ENCOUNTER — Other Ambulatory Visit: Payer: Self-pay

## 2021-01-29 ENCOUNTER — Encounter: Payer: Self-pay | Admitting: *Deleted

## 2021-01-29 VITALS — BP 133/68 | HR 76 | Temp 98.7°F | Ht 68.11 in | Wt 185.5 lb

## 2021-01-29 DIAGNOSIS — Z0000 Encounter for general adult medical examination without abnormal findings: Secondary | ICD-10-CM | POA: Insufficient documentation

## 2021-01-29 NOTE — Patient Instructions (Signed)
Gave patient hand-out, Women Staying Healthy, Active and Well from [facility], with education on breast health, pap smears, heart and colon health. 

## 2021-01-29 NOTE — Progress Notes (Signed)
  Subjective:     Patient ID: Kristen Kim, female   DOB: July 19, 1971, 50 y.o.   MRN: [id number]  HPI   Review of Systems     Objective:   Physical Exam Chest:  Breasts:     Right: No swelling, bleeding, inverted nipple, mass, nipple discharge, skin change, tenderness, axillary adenopathy or supraclavicular adenopathy.     Left: No swelling, bleeding, inverted nipple, mass, nipple discharge, skin change, tenderness, axillary adenopathy or supraclavicular adenopathy.        Comments: Patient with bilateral thickened area's throughout bilateral breast making for a difficulty cbe Abdominal:     Palpations: There is no hepatomegaly or splenomegaly.    Genitourinary:    Exam position: Lithotomy position.     Labia:        Right: No rash, tenderness, lesion or injury.        Left: No rash, tenderness, lesion or injury.      Vagina: No signs of injury and foreign body. No vaginal discharge, erythema, tenderness, bleeding, lesions or prolapsed vaginal walls.     Cervix: Friability present. No cervical motion tenderness, discharge, lesion, erythema, cervical bleeding or eversion.     Uterus: Not deviated and not enlarged.      Adnexa:        Right: No mass.         Left: No mass.       Rectum: No mass.     Comments: Friable on exam Lymphadenopathy:     Upper Body:     Right upper body: No supraclavicular or axillary adenopathy.     Left upper body: No supraclavicular or axillary adenopathy.        Assessment:     50 year old Hispanic female returns to [facility] for annual screening.  Kristen Kim, the interpreter present during the interview and exam.  Clinical breast exam was difficult due extreme amount of firm thickening bilateral.  Taught self breast awareness.  Specimen collected for pap smear without difficulty.  Patient has been screened for eligibility.  She does not have any insurance, Medicare or Medicaid.  She also meets financial eligibility.   Risk Assessment     Risk Scores      01/29/2021 01/17/2020   Last edited by: Scarlett Presto, RN Jim Like, RN   5-year risk: 1.3 % 1.4 %   Lifetime risk: 11.3 % 11.6 %            Plan:     Screening mammogram ordered.  Specimen for pap sent to the lab.  Will follow up per [facility] protocol.

## 2021-02-01 LAB — IGP, APTIMA HPV: HPV Aptima: NEGATIVE

## 2021-02-03 ENCOUNTER — Encounter: Payer: Self-pay | Admitting: *Deleted

## 2021-02-03 NOTE — Progress Notes (Signed)
Letter mailed to inform patient of her normal mammogram and pap smear results.  Next mammo in 1 year and pap in 5 years.

## 2021-11-24 ENCOUNTER — Other Ambulatory Visit: Payer: Self-pay

## 2021-11-24 DIAGNOSIS — N6310 Unspecified lump in the right breast, unspecified quadrant: Secondary | ICD-10-CM

## 2021-11-26 ENCOUNTER — Other Ambulatory Visit: Payer: Self-pay

## 2021-11-26 ENCOUNTER — Ambulatory Visit: Payer: 59

## 2021-12-01 ENCOUNTER — Other Ambulatory Visit: Payer: Self-pay

## 2022-02-18 ENCOUNTER — Other Ambulatory Visit: Payer: Self-pay | Admitting: Family Medicine

## 2022-02-18 DIAGNOSIS — Z1231 Encounter for screening mammogram for malignant neoplasm of breast: Secondary | ICD-10-CM

## 2022-03-26 ENCOUNTER — Ambulatory Visit
Admission: RE | Admit: 2022-03-26 | Discharge: 2022-03-26 | Disposition: A | Discharge status: Home / Self Care | Payer: 59 | Source: Ambulatory Visit | Attending: Family Medicine | Admitting: Family Medicine

## 2022-03-26 ENCOUNTER — Encounter: Payer: Self-pay | Admitting: Radiology

## 2022-03-26 DIAGNOSIS — Z1231 Encounter for screening mammogram for malignant neoplasm of breast: Secondary | ICD-10-CM | POA: Insufficient documentation

## 2022-05-28 ENCOUNTER — Ambulatory Visit: Payer: Self-pay | Admitting: Nurse Practitioner

## 2022-05-28 NOTE — Progress Notes (Deleted)
   LMP 01/16/2021    Subjective:    Patient ID: Kristen Kim, female    DOB: 07-28-1971, 51 y.o.   MRN: [id number]  HPI: Kristen Kim is a 51 y.o. female  No chief complaint on file.  Patient presents to clinic to establish care with new PCP.  Introduced to Publishing rights manager role and practice setting.  All questions answered.  Discussed provider/patient relationship and expectations.  Patient reports a history of ***. Patient denies a history of: Hypertension, Elevated Cholesterol, Diabetes, Thyroid problems, Depression, Anxiety, Neurological problems, and Abdominal problems.   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   BREAST BIOPSY Left 11/18/2018   Procedure: EXCISIONAL  BIOPSY LEFT BREAST MASS;  Surgeon: Ancil Linsey, MD;  Location: [facility] ORS;  Service: General;  Laterality: Left;   BREAST EXCISIONAL BIOPSY Left 2020   benign per pt/video interpreter   CHOLECYSTECTOMY     Family History  Problem Relation Age of Onset   Colon cancer Mother 67   Breast cancer Neg Hx      Review of Systems  Per HPI unless specifically indicated above     Objective:    LMP 01/16/2021   Wt Readings from Last 3 Encounters:  01/29/21 185 lb 8 oz (84.1 kg)  01/17/20 183 lb (83 kg)  12/01/18 176 lb 6.4 oz (80 kg)    Physical Exam  Results for orders placed or performed in visit on 01/29/21  IGP, Aptima HPV  Result Value Ref Range   Interpretation NILM    Category NIL    Adequacy ENDO    Clinician Provided ICD10 Comment    Performed by: Comment    QC reviewed by: Comment    Note: Comment    Test Methodology Comment    HPV Aptima Negative  Negative      Assessment & Plan:   Problem List Items Addressed This Visit   None Visit Diagnoses     Encounter to establish care    -  Primary        Follow up plan: No follow-ups on file.

## 2022-08-07 ENCOUNTER — Encounter: Payer: Self-pay | Admitting: *Deleted

## 2022-08-07 ENCOUNTER — Other Ambulatory Visit: Payer: Self-pay

## 2022-08-07 ENCOUNTER — Ambulatory Visit: Payer: 59 | Admitting: Anesthesiology

## 2022-08-07 ENCOUNTER — Ambulatory Visit
Admission: RE | Admit: 2022-08-07 | Discharge: 2022-08-07 | Disposition: A | Discharge status: Home / Self Care | Payer: 59 | Attending: Gastroenterology | Admitting: Gastroenterology

## 2022-08-07 ENCOUNTER — Encounter
Admission: RE | Disposition: A | Discharge status: Home / Self Care | Payer: Self-pay | Source: Home / Self Care | Attending: Gastroenterology

## 2022-08-07 DIAGNOSIS — Z800 Family history of malignant neoplasm of digestive organs: Secondary | ICD-10-CM | POA: Diagnosis not present

## 2022-08-07 DIAGNOSIS — Z1211 Encounter for screening for malignant neoplasm of colon: Secondary | ICD-10-CM | POA: Insufficient documentation

## 2022-08-07 DIAGNOSIS — Z9049 Acquired absence of other specified parts of digestive tract: Secondary | ICD-10-CM | POA: Insufficient documentation

## 2022-08-07 DIAGNOSIS — K640 First degree hemorrhoids: Secondary | ICD-10-CM | POA: Diagnosis not present

## 2022-08-07 HISTORY — PX: COLONOSCOPY WITH PROPOFOL: SHX5780

## 2022-08-07 LAB — POCT PREGNANCY, URINE: Preg Test, Ur: NEGATIVE

## 2022-08-07 SURGERY — COLONOSCOPY WITH PROPOFOL
Anesthesia: General

## 2022-08-07 MED ORDER — PROPOFOL 10 MG/ML IV BOLUS
INTRAVENOUS | Status: AC
  Filled 2022-08-07: qty 20

## 2022-08-07 MED ORDER — SODIUM CHLORIDE 0.9 % IV SOLN: INTRAVENOUS | Status: DC

## 2022-08-07 MED ORDER — PROPOFOL 500 MG/50ML IV EMUL
INTRAVENOUS | Status: DC | PRN
  Administered 2022-08-07: 60 mg via INTRAVENOUS
  Administered 2022-08-07: 150 ug/kg/min via INTRAVENOUS

## 2022-08-07 NOTE — Interval H&P Note (Signed)
History and Physical Interval Note:  08/07/2022 10:46 AM  Kristen Kim  has presented today for surgery, with the diagnosis of FH Colon Cancer.  The various methods of treatment have been discussed with the patient and family. After consideration of risks, benefits and other options for treatment, the patient has consented to  Procedure(s) with comments: COLONOSCOPY WITH PROPOFOL (N/A) - SPANISH INTERPRETER as a surgical intervention.  The patient's history has been reviewed, patient examined, no change in status, stable for surgery.  I have reviewed the patient's chart and labs.  Questions were answered to the patient's satisfaction.     Lesly Rubenstein  Ok to proceed with colonoscopy

## 2022-08-07 NOTE — Anesthesia Postprocedure Evaluation (Signed)
Anesthesia Post Note  Patient: Kristen Kim  Procedure(s) Performed: COLONOSCOPY WITH PROPOFOL  Patient location during evaluation: PACU Anesthesia Type: General Level of consciousness: awake and alert, oriented and patient cooperative Pain management: pain level controlled Vital Signs Assessment: post-procedure vital signs reviewed and stable Respiratory status: spontaneous breathing, nonlabored ventilation and respiratory function stable Cardiovascular status: blood pressure returned to baseline and stable Postop Assessment: adequate PO intake Anesthetic complications: no   No notable events documented.   Last Vitals:  Vitals:   08/07/22 1112 08/07/22 1121  BP: 100/70 105/73  Pulse: 81 72  Resp: 15 14  Temp: (!) 36.4 C   SpO2: 96% 98%    Last Pain:  Vitals:   08/07/22 1121  TempSrc:   PainSc: 0-No pain                 Darrin Nipper

## 2022-08-07 NOTE — Transfer of Care (Signed)
Immediate Anesthesia Transfer of Care Note  Patient: Kristen Kim  Procedure(s) Performed: COLONOSCOPY WITH PROPOFOL  Patient Location: PACU  Anesthesia Type:General  Level of Consciousness: awake and alert   Airway & Oxygen Therapy: Patient Spontanous Breathing and Patient connected to nasal cannula oxygen  Post-op Assessment: Report given to RN and Post -op Vital signs reviewed and stable  Post vital signs: Reviewed and stable  Last Vitals:  Vitals Value Taken Time  BP    Temp    Pulse 81 08/07/22 1112  Resp 16 08/07/22 1112  SpO2 96 % 08/07/22 1112  Vitals shown include unvalidated device data.  Last Pain:  Vitals:   08/07/22 1025  TempSrc: Temporal  PainSc: 0-No pain         Complications: No notable events documented.

## 2022-08-07 NOTE — Op Note (Signed)
[facility] Gastroenterology Patient Name: Kristen Kim Procedure Date: 08/07/2022 10:36 AM MRN: [id number] Account #: [id number] Date of Birth: [birth year] Admit Type: Outpatient Age: 51 Room: [facility] ENDO ROOM 3 Gender: Female Note Status: Finalized Instrument Name: Jasper Riling [id number] Procedure:             Colonoscopy Indications:           Screening patient at increased risk: Family history of                         1st-degree relative with colorectal cancer at age 80                         years (or older) Providers:             Andrey Farmer MD, MD Medicines:             Monitored Anesthesia Care Complications:         No immediate complications. Procedure:             Pre-Anesthesia Assessment:                        - Prior to the procedure, a History and Physical was                         performed, and patient medications and allergies were                         reviewed. The patient is competent. The risks and                         benefits of the procedure and the sedation options and                         risks were discussed with the patient. All questions                         were answered and informed consent was obtained.                         Patient identification and proposed procedure were                         verified by the physician, the nurse, the                         anesthesiologist, the anesthetist and the technician                         in the endoscopy suite. Mental Status Examination:                         alert and oriented. Airway Examination: normal                         oropharyngeal airway and neck mobility. Respiratory                         Examination: clear to auscultation. CV Examination:  normal. Prophylactic Antibiotics: The patient does not                         require prophylactic antibiotics. Prior                         Anticoagulants: The patient has  taken no previous                         anticoagulant or antiplatelet agents. ASA Grade                         Assessment: II - A patient with mild systemic disease.                         After reviewing the risks and benefits, the patient                         was deemed in satisfactory condition to undergo the                         procedure. The anesthesia plan was to use monitored                         anesthesia care (MAC). Immediately prior to                         administration of medications, the patient was                         re-assessed for adequacy to receive sedatives. The                         heart rate, respiratory rate, oxygen saturations,                         blood pressure, adequacy of pulmonary ventilation, and                         response to care were monitored throughout the                         procedure. The physical status of the patient was                         re-assessed after the procedure.                        After obtaining informed consent, the colonoscope was                         passed under direct vision. Throughout the procedure,                         the patient's blood pressure, pulse, and oxygen                         saturations were monitored continuously. The  Colonoscope was introduced through the anus and                         advanced to the the cecum, identified by appendiceal                         orifice and ileocecal valve. The colonoscopy was                         performed without difficulty. The patient tolerated                         the procedure well. The quality of the bowel                         preparation was good. Findings:      The perianal and digital rectal examinations were normal.      Internal hemorrhoids were found during retroflexion. The hemorrhoids       were Grade I (internal hemorrhoids that do not prolapse).      The exam was otherwise without  abnormality on direct and retroflexion       views. Impression:            - Internal hemorrhoids.                        - The examination was otherwise normal on direct and                         retroflexion views.                        - No specimens collected. Recommendation:        - Discharge patient to home.                        - Resume previous diet.                        - Continue present medications.                        - Repeat colonoscopy in 10 years for screening                         purposes.                        - Return to referring physician as previously                         scheduled. Procedure Code(s):     --- Professional ---                        V7616, Colorectal cancer screening; colonoscopy on                         individual at high risk Diagnosis Code(s):     --- Professional ---  Z80.0, Family history of malignant neoplasm of                         digestive organs                        K64.0, First degree hemorrhoids CPT copyright 2019 American Medical Association. All rights reserved. The codes documented in this report are preliminary and upon coder review may  be revised to meet current compliance requirements. Andrey Farmer MD, MD 08/07/2022 11:16:48 AM Number of Addenda: 0 Note Initiated On: 08/07/2022 10:36 AM Scope Withdrawal Time: 0 hours 7 minutes 53 seconds  Total Procedure Duration: 0 hours 12 minutes 18 seconds  Estimated Blood Loss:  Estimated blood loss: none.      [facility]

## 2022-08-07 NOTE — Anesthesia Preprocedure Evaluation (Addendum)
Anesthesia Evaluation  Patient identified by MRN, date of birth, ID band Patient awake    Reviewed: Allergy & Precautions, NPO status , Patient's Chart, lab work & pertinent test results  History of Anesthesia Complications Negative for: history of anesthetic complications  Airway Mallampati: I   Neck ROM: Full    Dental  (+) Missing   Pulmonary neg pulmonary ROS,    Pulmonary exam normal breath sounds clear to auscultation       Cardiovascular Exercise Tolerance: Good negative cardio ROS Normal cardiovascular exam Rhythm:Regular Rate:Normal     Neuro/Psych negative neurological ROS     GI/Hepatic GERD  ,  Endo/Other  negative endocrine ROS  Renal/GU negative Renal ROS     Musculoskeletal   Abdominal   Peds  Hematology negative hematology ROS (+)   Anesthesia Other Findings   Reproductive/Obstetrics                            Anesthesia Physical Anesthesia Plan  ASA: 2  Anesthesia Plan: General   Post-op Pain Management:    Induction: Intravenous  PONV Risk Score and Plan: 3 and Propofol infusion, TIVA and Treatment may vary due to age or medical condition  Airway Management Planned: Natural Airway  Additional Equipment:   Intra-op Plan:   Post-operative Plan:   Informed Consent: I have reviewed the patients History and Physical, chart, labs and discussed the procedure including the risks, benefits and alternatives for the proposed anesthesia with the patient or authorized representative who has indicated his/her understanding and acceptance.       Plan Discussed with: CRNA  Anesthesia Plan Comments: (LMA/GETA backup discussed.  Patient consented for risks of anesthesia including but not limited to:  - adverse reactions to medications - damage to eyes, teeth, lips or other oral mucosa - nerve damage due to positioning  - sore throat or hoarseness - damage to  heart, brain, nerves, lungs, other parts of body or loss of life  Informed patient about role of CRNA in peri- and intra-operative care.  Patient voiced understanding.)       Anesthesia Quick Evaluation

## 2022-08-07 NOTE — H&P (Signed)
Outpatient short stay form Pre-procedure 08/07/2022  Lesly Rubenstein, MD  Primary Physician: [facility]  Reason for visit:  Screening  History of present illness:    51 y/o lady here for index screening colonoscopy. Her mother had colon cancer in her 81's. No blood thinners. History of cholecystectomy.    Current Facility-Administered Medications:    0.9 %  sodium chloride infusion, , Intravenous, Continuous, Nobuko Gsell, Hilton Cork, MD, Last Rate: 20 mL/hr at 08/07/22 1035, New Bag at 08/07/22 1035  Medications Prior to Admission  Medication Sig Dispense Refill Last Dose   acetaminophen (TYLENOL) 500 MG tablet Take 500 mg by mouth as needed.      ibuprofen (ADVIL,MOTRIN) 200 MG tablet Take 200 mg by mouth as needed.   08/04/2022   Multiple Vitamin (MULTIVITAMIN WITH MINERALS) TABS tablet Take 1 tablet by mouth daily.      oxyCODONE-acetaminophen (PERCOCET/ROXICET) 5-325 MG tablet Take 1 tablet by mouth every 4 (four) hours as needed for severe pain. 30 tablet 0      Allergies  Allergen Reactions   Pineapple    Shellfish Allergy      Past Medical History:  Diagnosis Date   Hepatitis    at 51 yrs old and again at 58   Mass of lower outer quadrant of left breast 10/27/2018    Review of systems:  Otherwise negative.    Physical Exam  Gen: Alert, oriented. Appears stated age.  HEENT: PERRLA. Lungs: No respiratory distress CV: RRR Abd: soft, benign, no masses Ext: No edema    Planned procedures: Proceed with colonoscopy. The patient understands the nature of the planned procedure, indications, risks, alternatives and potential complications including but not limited to bleeding, infection, perforation, damage to internal organs and possible oversedation/side effects from anesthesia. The patient agrees and gives consent to proceed.  Please refer to procedure notes for findings, recommendations and patient disposition/instructions.     Lesly Rubenstein, MD [facility] Gastroenterology

## 2022-08-10 ENCOUNTER — Encounter: Payer: Self-pay | Admitting: Gastroenterology

## 2023-04-30 ENCOUNTER — Other Ambulatory Visit: Payer: Self-pay | Admitting: Family Medicine

## 2023-04-30 DIAGNOSIS — Z1231 Encounter for screening mammogram for malignant neoplasm of breast: Secondary | ICD-10-CM

## 2023-05-12 ENCOUNTER — Ambulatory Visit
Admission: RE | Admit: 2023-05-12 | Discharge: 2023-05-12 | Disposition: A | Discharge status: Home / Self Care | Payer: 59 | Source: Ambulatory Visit | Attending: Family Medicine | Admitting: Family Medicine

## 2023-05-12 DIAGNOSIS — Z1231 Encounter for screening mammogram for malignant neoplasm of breast: Secondary | ICD-10-CM | POA: Insufficient documentation

## 2024-04-26 ENCOUNTER — Other Ambulatory Visit: Payer: Self-pay | Admitting: Family Medicine

## 2024-04-26 ENCOUNTER — Encounter: Payer: Self-pay | Admitting: Family Medicine

## 2024-04-26 DIAGNOSIS — Z1231 Encounter for screening mammogram for malignant neoplasm of breast: Secondary | ICD-10-CM

## 2024-05-24 ENCOUNTER — Ambulatory Visit
Admission: RE | Admit: 2024-05-24 | Discharge: 2024-05-24 | Disposition: A | Discharge status: Home / Self Care | Source: Ambulatory Visit | Attending: Family Medicine | Admitting: Family Medicine

## 2024-05-24 DIAGNOSIS — Z1231 Encounter for screening mammogram for malignant neoplasm of breast: Secondary | ICD-10-CM | POA: Insufficient documentation

## 2024-08-02 ENCOUNTER — Inpatient Hospital Stay

## 2024-08-02 ENCOUNTER — Encounter: Payer: Self-pay | Admitting: Oncology

## 2024-08-02 ENCOUNTER — Inpatient Hospital Stay: Payer: Self-pay | Attending: Oncology | Admitting: Oncology

## 2024-08-02 VITALS — BP 140/87 | HR 71 | Temp 96.5°F | Resp 18 | Wt 191.9 lb

## 2024-08-02 DIAGNOSIS — R778 Other specified abnormalities of plasma proteins: Secondary | ICD-10-CM | POA: Diagnosis not present

## 2024-08-02 DIAGNOSIS — Z8619 Personal history of other infectious and parasitic diseases: Secondary | ICD-10-CM | POA: Insufficient documentation

## 2024-08-02 DIAGNOSIS — Z800 Family history of malignant neoplasm of digestive organs: Secondary | ICD-10-CM | POA: Diagnosis not present

## 2024-08-02 DIAGNOSIS — M545 Low back pain, unspecified: Secondary | ICD-10-CM | POA: Diagnosis not present

## 2024-08-02 LAB — HEPATITIS PANEL, ACUTE
HCV Ab: NONREACTIVE
Hep A IgM: NONREACTIVE
Hep B C IgM: NONREACTIVE
Hepatitis B Surface Ag: NONREACTIVE

## 2024-08-02 LAB — CBC WITH DIFFERENTIAL/PLATELET
Abs Immature Granulocytes: 0.01 K/uL (ref 0.00–0.07)
Basophils Absolute: 0 K/uL (ref 0.0–0.1)
Basophils Relative: 0 %
Eosinophils Absolute: 0 K/uL (ref 0.0–0.5)
Eosinophils Relative: 1 %
HCT: 38.5 % (ref 36.0–46.0)
Hemoglobin: 12.6 g/dL (ref 12.0–15.0)
Immature Granulocytes: 0 %
Lymphocytes Relative: 36 %
Lymphs Abs: 1.9 K/uL (ref 0.7–4.0)
MCH: 29.1 pg (ref 26.0–34.0)
MCHC: 32.7 g/dL (ref 30.0–36.0)
MCV: 88.9 fL (ref 80.0–100.0)
Monocytes Absolute: 0.4 K/uL (ref 0.1–1.0)
Monocytes Relative: 8 %
Neutro Abs: 2.8 K/uL (ref 1.7–7.7)
Neutrophils Relative %: 55 %
Platelets: 272 K/uL (ref 150–400)
RBC: 4.33 MIL/uL (ref 3.87–5.11)
RDW: 12.8 % (ref 11.5–15.5)
WBC: 5.2 K/uL (ref 4.0–10.5)
nRBC: 0 % (ref 0.0–0.2)

## 2024-08-02 LAB — COMPREHENSIVE METABOLIC PANEL WITH GFR
ALT: 29 U/L (ref 0–44)
AST: 24 U/L (ref 15–41)
Albumin: 4.2 g/dL (ref 3.5–5.0)
Alkaline Phosphatase: 75 U/L (ref 38–126)
Anion gap: 7 (ref 5–15)
BUN: 13 mg/dL (ref 6–20)
CO2: 26 mmol/L (ref 22–32)
Calcium: 9.3 mg/dL (ref 8.9–10.3)
Chloride: 102 mmol/L (ref 98–111)
Creatinine, Ser: 0.71 mg/dL (ref 0.44–1.00)
GFR, Estimated: >60 mL/min (ref >60)
Glucose, Bld: 88 mg/dL (ref 70–99)
Potassium: 3.6 mmol/L (ref 3.5–5.1)
Sodium: 135 mmol/L (ref 135–145)
Total Bilirubin: 0.8 mg/dL (ref 0.0–1.2)
Total Protein: 9.2 g/dL — ABNORMAL HIGH (ref 6.5–8.1)

## 2024-08-02 LAB — C-REACTIVE PROTEIN: CRP: 0.8 mg/dL (ref <1.0)

## 2024-08-02 LAB — HIV ANTIBODY (ROUTINE TESTING W REFLEX): HIV Screen 4th Generation wRfx: NONREACTIVE

## 2024-08-02 LAB — SEDIMENTATION RATE: Sed Rate: 53 mm/h — ABNORMAL HIGH (ref 0–30)

## 2024-08-02 NOTE — Progress Notes (Signed)
 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 Previous SPEP result details are not available to me in EMR. I will check CBC, CMP, multiple myeloma panel, light chain ratio, ESR, CRP, hepatitis, HIV,   Orders Placed This Encounter  Procedures   CBC with Differential/Platelet    Standing Status:   Future    Number of Occurrences:   1    Expected Date:   08/02/2024    Expiration Date:   10/31/2024   Comprehensive metabolic panel with GFR    Standing Status:   Future    Number of Occurrences:   1    Expected Date:   08/02/2024    Expiration Date:   10/31/2024   Hepatitis panel, acute    Standing Status:   Future    Number of Occurrences:   1    Expected Date:   08/02/2024    Expiration Date:   10/31/2024   HIV Antibody (routine testing w rflx)    Standing Status:   Future    Number of Occurrences:   1    Expected Date:   08/02/2024    Expiration Date:   10/31/2024   Multiple Myeloma Panel (SPEP&IFE w/QIG)    Standing Status:   Future    Number of Occurrences:   1    Expected Date:   08/02/2024    Expiration Date:   10/31/2024   Kappa/lambda light chains    Standing Status:   Future    Number of Occurrences:   1    Expected Date:   08/02/2024    Expiration Date:   10/31/2024   Sedimentation rate    Standing Status:   Future    Number of Occurrences:   1    Expected Date:   08/02/2024    Expiration Date:   10/31/2024   C-reactive protein    Standing  Status:   Future    Number of Occurrences:   1    Expected Date:   08/02/2024    Expiration Date:   10/31/2024    All questions were answered. The patient knows to call the clinic with any problems, questions or concerns.  Call Babara, MD, PhD [facility] Hematology Oncology 08/02/2024    HISTORY OF PRESENTING ILLNESS:  Kristen Kim is a 53 y.o. 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  Patient recently had work up done at primary care provider's office office. Labs reviewed, she has increased total protein over 10. SPEP showed no M spike, polyclonal gammopathy.    She denies history of abnormal bone pain or bone fracture. Patient denies history of recurrent infection or atypical infections such as shingles of meningitis. Denies chills, night sweats, anorexia or abnormal weight loss. She reports intermittent right lower back pain. Patient reports progressive worse allergy symptoms. Family history of colon cancer, she is up to date for colonoscopy which was done in 2023. She reported history of hepatitis when she was 53 years old.  She did not  know type of the hepatitis.   MEDICAL HISTORY:  Past Medical History:  Diagnosis Date   Hepatitis    at 53 yrs old and again at 72   Mass of lower outer quadrant of left breast 10/27/2018    SURGICAL HISTORY: Past Surgical History:  Procedure Laterality Date   BREAST BIOPSY Bilateral 2017   PASH   BREAST BIOPSY Left 11/18/2018   Procedure: EXCISIONAL  BIOPSY LEFT BREAST MASS;  Surgeon: Nicholaus Selinda Birmingham, MD;  Location: [facility] ORS;  Service: General;  Laterality: Left;   BREAST EXCISIONAL BIOPSY Left 2020   benign per pt/video interpreter   CHOLECYSTECTOMY     COLONOSCOPY WITH PROPOFOL  N/A 08/07/2022   Procedure: COLONOSCOPY WITH PROPOFOL ;  Surgeon: Maryruth Ole DASEN, MD;  Location: [facility] ENDOSCOPY;  Service: Endoscopy;  Laterality: N/A;  SPANISH  INTERPRETER    SOCIAL HISTORY: Social History   Socioeconomic History   Marital status: Married    Spouse name: Not on file   Number of children: Not on file   Years of education: Not on file   Highest education level: Not on file  Occupational History   Not on file  Tobacco Use   Smoking status: Never   Smokeless tobacco: Never  Vaping Use   Vaping status: Never Used  Substance and Sexual Activity   Alcohol use: Never   Drug use: Never   Sexual activity: Yes  Other Topics Concern   Not on file  Social History Narrative   ** Merged History Encounter **       Social Drivers of Health   Financial Resource Strain: Not on file  Food Insecurity: No Food Insecurity (08/02/2024)   Hunger Vital Sign    Worried About Running Out of Food in the Last Year: Never true    Ran Out of Food in the Last Year: Never true  Transportation Needs: No Transportation Needs (08/02/2024)   PRAPARE - Administrator, Civil Service (Medical): No    Lack of Transportation (Non-Medical): No  Physical Activity: Not on file  Stress: Not on file  Social Connections: Not on file  Intimate Partner Violence: Not At Risk (08/02/2024)   Humiliation, Afraid, Rape, and Kick questionnaire    Fear of Current or Ex-Partner: No    Emotionally Abused: No    Physically Abused: No    Sexually Abused: No    FAMILY HISTORY: Family History  Problem Relation Age of Onset   Colon cancer Mother 71   Breast cancer Neg Hx     ALLERGIES:  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  MEDICATIONS:  Current Outpatient Medications  Medication Sig Dispense Refill   acetaminophen  (TYLENOL ) 500 MG tablet Take 500 mg by mouth as needed.     ibuprofen  (ADVIL ,MOTRIN ) 200 MG tablet Take 200 mg by mouth as needed.     Multiple Vitamin (MULTIVITAMIN WITH MINERALS) TABS tablet Take 1 tablet by mouth daily.     oxyCODONE -acetaminophen  (PERCOCET/ROXICET) 5-325 MG tablet Take 1 tablet by mouth every 4 (four)  hours as needed for severe pain. (Patient not taking: Reported on 08/02/2024) 30 tablet 0   No current facility-administered medications for this visit.    Review of Systems  Constitutional:  Negative for appetite change, chills, fatigue and fever.  HENT:   Negative for hearing loss and voice change.   Eyes:  Negative for eye problems.  Respiratory:  Negative for chest tightness and cough.   Cardiovascular:  Negative for chest pain.  Gastrointestinal:  Negative for abdominal distention, abdominal pain and blood in stool.  Endocrine: Negative for hot flashes.  Genitourinary:  Negative for difficulty urinating and frequency.   Musculoskeletal:  Positive for back pain. Negative for arthralgias.  Skin:  Negative for itching and rash.  Neurological:  Negative for extremity weakness.  Hematological:  Negative for adenopathy.  Psychiatric/Behavioral:  Negative for confusion.    PHYSICAL EXAMINATION: ECOG PERFORMANCE STATUS: 0 - Asymptomatic Vitals:   08/02/24 1539  BP: (!) 140/87  Pulse: 71  Resp: 18  Temp: (!) 96.5 F (35.8 C)  SpO2: 97%   Filed Weights   08/02/24 1539  Weight: 191 lb 14.4 oz (87 kg)    Physical Exam Constitutional:      General: She is not in acute distress.    Appearance: She is obese.  HENT:     Head: Normocephalic and atraumatic.  Eyes:     General: No scleral icterus. Cardiovascular:     Rate and Rhythm: Normal rate and regular rhythm.     Heart sounds: Normal heart sounds.  Pulmonary:     Effort: Pulmonary effort is normal. No respiratory distress.     Breath sounds: No wheezing.  Abdominal:     General: Bowel sounds are normal. There is no distension.     Palpations: Abdomen is soft.  Musculoskeletal:        General: No deformity. Normal range of motion.     Cervical back: Normal range of motion and neck supple.  Skin:    General: Skin is warm and dry.     Findings: No erythema or rash.  Neurological:     Mental Status: She is alert and  oriented to person, place, and time. Mental status is at baseline.     Cranial Nerves: No cranial nerve deficit.     Coordination: Coordination normal.  Psychiatric:        Mood and Affect: Mood normal.      LABORATORY DATA:  I have reviewed the data as listed    Latest Ref Rng & Units 08/02/2024    4:16 PM 11/04/2018    2:49 PM 12/20/2017    9:01 AM  CBC  WBC 4.0 - 10.5 K/uL 5.2  5.5  4.6   Hemoglobin 12.0 - 15.0 g/dL 87.3  87.7  86.6   Hematocrit 36.0 - 46.0 % 38.5  36.7  38.8   Platelets 150 - 400 K/uL 272  281  347       Latest Ref Rng & Units 08/02/2024    4:16 PM 11/04/2018    2:49 PM 12/20/2017    9:01 AM  CMP  Glucose 70 - 99 mg/dL 88  893  869   BUN 6 - 20 mg/dL 13  13  13    Creatinine 0.44 - 1.00 mg/dL 9.28  9.31  9.09   Sodium 135 - 145 mmol/L 135  138  140   Potassium 3.5 - 5.1 mmol/L 3.6  3.4  3.6   Chloride 98 - 111 mmol/L 102  109  109   CO2 22 - 32 mmol/L 26  22  21    Calcium 8.9 - 10.3 mg/dL 9.3  9.1  9.2   Total Protein 6.5 - 8.1 g/dL 9.2   9.0   Total Bilirubin 0.0 - 1.2 mg/dL 0.8   0.6   Alkaline Phos 38 - 126 U/L 75   44   AST 15 - 41 U/L 24   33   ALT 0 -  44 U/L 29   26             RADIOGRAPHIC STUDIES: I have personally reviewed the radiological images as listed and agreed with the findings in the report.  No results found.

## 2024-08-02 NOTE — Assessment & Plan Note (Signed)
 Previous protein electrophoresis showed no M protein.  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 Previous SPEP result details are not available to me in EMR. I will check CBC, CMP, multiple myeloma panel, light chain ratio, ESR, CRP, hepatitis, HIV,

## 2024-08-03 LAB — KAPPA/LAMBDA LIGHT CHAINS
Kappa free light chain: 57.5 mg/L — ABNORMAL HIGH (ref 3.3–19.4)
Kappa, lambda light chain ratio: 1.33 (ref 0.26–1.65)
Lambda free light chains: 43.2 mg/L — ABNORMAL HIGH (ref 5.7–26.3)

## 2024-08-04 LAB — MULTIPLE MYELOMA PANEL, SERUM
Albumin SerPl Elph-Mcnc: 3.8 g/dL (ref 2.9–4.4)
Albumin/Glob SerPl: 0.8 (ref 0.7–1.7)
Alpha 1: 0.2 g/dL (ref 0.0–0.4)
Alpha2 Glob SerPl Elph-Mcnc: 0.6 g/dL (ref 0.4–1.0)
B-Globulin SerPl Elph-Mcnc: 1.1 g/dL (ref 0.7–1.3)
Gamma Glob SerPl Elph-Mcnc: 3.1 g/dL — ABNORMAL HIGH (ref 0.4–1.8)
Globulin, Total: 5 g/dL — ABNORMAL HIGH (ref 2.2–3.9)
IgA: 182 mg/dL (ref 87–352)
IgG (Immunoglobin G), Serum: 3351 mg/dL — ABNORMAL HIGH (ref 586–1602)
IgM (Immunoglobulin M), Srm: 109 mg/dL (ref 26–217)
Total Protein ELP: 8.8 g/dL — ABNORMAL HIGH (ref 6.0–8.5)

## 2024-09-04 ENCOUNTER — Inpatient Hospital Stay: Attending: Oncology | Admitting: Oncology

## 2024-09-04 ENCOUNTER — Encounter: Payer: Self-pay | Admitting: Oncology

## 2024-09-04 VITALS — BP 145/89 | HR 72 | Temp 98.4°F | Resp 18 | Wt 190.2 lb

## 2024-09-04 DIAGNOSIS — R700 Elevated erythrocyte sedimentation rate: Secondary | ICD-10-CM | POA: Diagnosis not present

## 2024-09-04 DIAGNOSIS — Z800 Family history of malignant neoplasm of digestive organs: Secondary | ICD-10-CM | POA: Insufficient documentation

## 2024-09-04 DIAGNOSIS — M545 Low back pain, unspecified: Secondary | ICD-10-CM | POA: Insufficient documentation

## 2024-09-04 DIAGNOSIS — R778 Other specified abnormalities of plasma proteins: Secondary | ICD-10-CM | POA: Diagnosis not present

## 2024-09-04 NOTE — Assessment & Plan Note (Signed)
 Repeat lab results reviewed with patient. She has normal monoclonal protein on protein electrophoresis.  Immunofixation showed polyclonal gammopathy which is likely secondary to chronic inflammation. I will hold off further workup.

## 2024-09-04 NOTE — Assessment & Plan Note (Addendum)
 ESR is elevated, indicating underlying inflammation.  Recommend patient to further discuss with primary care provider to decide further workup and evaluation.

## 2024-09-04 NOTE — Progress Notes (Signed)
 Hematology/Oncology Progress note [phone] [phone]      Patient Care Team: Center, [facility] as PCP - General (General Practice) Brannock, Wanda SQUIBB, RN Babara Call, MD as Consulting Physician (Hematology and Oncology)  REFERRING PROVIDER: Center, [facility]  CHIEF COMPLAINTS/REASON FOR VISIT:  polyclonal gammopathy  ASSESSMENT & PLAN:   Abnormal SPEP Repeat lab results reviewed with patient. She has normal monoclonal protein on protein electrophoresis.  Immunofixation showed polyclonal gammopathy which is likely secondary to chronic inflammation. I will hold off further workup.  ESR raised ESR is elevated, indicating underlying inflammation.  Recommend patient to further discuss with primary care provider to decide further workup and evaluation.  Spanish interpreter was present for the entire encounter.  Patient is discharged from my clinic. I recommend patient to continue follow up with primary care physician. Patient may re-establish care in the future if clinically indicated.  All questions were answered. The patient knows to call the clinic with any problems, questions or concerns.  Call Babara, MD, PhD [facility] Hematology Oncology 09/04/2024    HISTORY OF PRESENTING ILLNESS:  Kristen Kim is a 53 y.o. 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  Patient recently had work up done at primary care provider's office office. Labs reviewed, she has increased total protein over 10. SPEP showed no M spike, polyclonal gammopathy.    She denies history of abnormal bone pain or bone fracture. Patient denies history of recurrent infection or atypical infections such as shingles of meningitis. Denies chills, night sweats, anorexia or abnormal weight loss. She reports intermittent right lower back pain. Patient reports progressive worse  allergy symptoms. Family history of colon cancer, she is up to date for colonoscopy which was done in 2023. She reported history of hepatitis when she was 53 years old.  She did not know type of the hepatitis.  INTERVAL HISTORY Kristen Kim is a 53 y.o. female who has above history reviewed by me today presents for follow up visit for abnormal SPEP Patient had blood work done and presented discussed results.  She has no new complaints.   MEDICAL HISTORY:  Past Medical History:  Diagnosis Date   Hepatitis    at 53 yrs old and again at 66   Mass of lower outer quadrant of left breast 10/27/2018    SURGICAL HISTORY: Past Surgical History:  Procedure Laterality Date   BREAST BIOPSY Bilateral 2017   PASH   BREAST BIOPSY Left 11/18/2018   Procedure: EXCISIONAL  BIOPSY LEFT BREAST MASS;  Surgeon: Nicholaus Selinda Birmingham, MD;  Location: [facility] ORS;  Service: General;  Laterality: Left;   BREAST EXCISIONAL BIOPSY Left 2020   benign per pt/video interpreter   CHOLECYSTECTOMY     COLONOSCOPY WITH PROPOFOL  N/A 08/07/2022   Procedure: COLONOSCOPY WITH PROPOFOL ;  Surgeon: Maryruth Ole DASEN, MD;  Location: [facility] ENDOSCOPY;  Service: Endoscopy;  Laterality: N/A;  SPANISH INTERPRETER    SOCIAL HISTORY: Social History   Socioeconomic History   Marital status: Married    Spouse name: Not on file   Number of children: Not on file   Years of education: Not on file   Highest education level: Not on file  Occupational History   Not on file  Tobacco Use   Smoking status: Never   Smokeless tobacco: Never  Vaping Use   Vaping status: Never Used  Substance and Sexual Activity   Alcohol use: Never  Drug use: Never   Sexual activity: Yes  Other Topics Concern   Not on file  Social History Narrative   ** Merged History Encounter **       Social Drivers of Health   Financial Resource Strain: Not on file  Food Insecurity: No Food Insecurity (08/02/2024)   Hunger Vital Sign    Worried  About Running Out of Food in the Last Year: Never true    Ran Out of Food in the Last Year: Never true  Transportation Needs: No Transportation Needs (08/02/2024)   PRAPARE - Administrator, Civil Service (Medical): No    Lack of Transportation (Non-Medical): No  Physical Activity: Not on file  Stress: Not on file  Social Connections: Not on file  Intimate Partner Violence: Not At Risk (08/02/2024)   Humiliation, Afraid, Rape, and Kick questionnaire    Fear of Current or Ex-Partner: No    Emotionally Abused: No    Physically Abused: No    Sexually Abused: No    FAMILY HISTORY: Family History  Problem Relation Age of Onset   Colon cancer Mother 43   Breast cancer Neg Hx     ALLERGIES:  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  MEDICATIONS:  Current Outpatient Medications  Medication Sig Dispense Refill   ibuprofen  (ADVIL ,MOTRIN ) 200 MG tablet Take 200 mg by mouth as needed.     Multiple Vitamin (MULTIVITAMIN WITH MINERALS) TABS tablet Take 1 tablet by mouth daily.     acetaminophen  (TYLENOL ) 500 MG tablet Take 500 mg by mouth as needed. (Patient not taking: Reported on 09/04/2024)     oxyCODONE -acetaminophen  (PERCOCET/ROXICET) 5-325 MG tablet Take 1 tablet by mouth every 4 (four) hours as needed for severe pain. (Patient not taking: Reported on 09/04/2024) 30 tablet 0   No current facility-administered medications for this visit.    Review of Systems  Constitutional:  Negative for appetite change, chills, fatigue and fever.  HENT:   Negative for hearing loss and voice change.   Eyes:  Negative for eye problems.  Respiratory:  Negative for chest tightness and cough.   Cardiovascular:  Negative for chest pain.  Gastrointestinal:  Negative for abdominal distention, abdominal pain and blood in stool.  Endocrine: Negative for hot flashes.  Genitourinary:  Negative for difficulty urinating and frequency.   Musculoskeletal:  Positive for back pain. Negative for  arthralgias.  Skin:  Negative for itching and rash.  Neurological:  Negative for extremity weakness.  Hematological:  Negative for adenopathy.  Psychiatric/Behavioral:  Negative for confusion.    PHYSICAL EXAMINATION: ECOG PERFORMANCE STATUS: 0 - Asymptomatic Vitals:   09/04/24 1536 09/04/24 1550  BP: (!) 136/95 (!) 145/89  Pulse: 72   Resp: 18   Temp: 98.4 F (36.9 C)   SpO2: 100%    Filed Weights   09/04/24 1536  Weight: 190 lb 3.2 oz (86.3 kg)    Physical Exam Constitutional:      General: She is not in acute distress.    Appearance: She is obese.  HENT:     Head: Normocephalic and atraumatic.  Eyes:     General: No scleral icterus. Cardiovascular:     Rate and Rhythm: Normal rate.  Pulmonary:     Effort: Pulmonary effort is normal. No respiratory distress.  Abdominal:     General: There is no distension.  Musculoskeletal:        General: No deformity. Normal range of motion.     Cervical back: Normal  range of motion.  Skin:    Findings: No erythema or rash.  Neurological:     Mental Status: She is alert and oriented to person, place, and time. Mental status is at baseline.  Psychiatric:        Mood and Affect: Mood normal.      LABORATORY DATA:  I have reviewed the data as listed    Latest Ref Rng & Units 08/02/2024    4:16 PM 11/04/2018    2:49 PM 12/20/2017    9:01 AM  CBC  WBC 4.0 - 10.5 K/uL 5.2  5.5  4.6   Hemoglobin 12.0 - 15.0 g/dL 87.3  87.7  86.6   Hematocrit 36.0 - 46.0 % 38.5  36.7  38.8   Platelets 150 - 400 K/uL 272  281  347       Latest Ref Rng & Units 08/02/2024    4:16 PM 11/04/2018    2:49 PM 12/20/2017    9:01 AM  CMP  Glucose 70 - 99 mg/dL 88  893  869   BUN 6 - 20 mg/dL 13  13  13    Creatinine 0.44 - 1.00 mg/dL 9.28  9.31  9.09   Sodium 135 - 145 mmol/L 135  138  140   Potassium 3.5 - 5.1 mmol/L 3.6  3.4  3.6   Chloride 98 - 111 mmol/L 102  109  109   CO2 22 - 32 mmol/L 26  22  21    Calcium 8.9 - 10.3 mg/dL 9.3  9.1  9.2    Total Protein 6.5 - 8.1 g/dL 9.2   9.0   Total Bilirubin 0.0 - 1.2 mg/dL 0.8   0.6   Alkaline Phos 38 - 126 U/L 75   44   AST 15 - 41 U/L 24   33   ALT 0 - 44 U/L 29   26    Lab Results  Component Value Date   MPROTEIN Not Observed 08/02/2024   KAPLAMBRATIO 1.33 08/02/2024          RADIOGRAPHIC STUDIES: I have personally reviewed the radiological images as listed and agreed with the findings in the report.  No results found.
